# Patient Record
Sex: Female | Born: 1980 | Hispanic: No | Marital: Single | Smoking: Current every day smoker
Health system: Southern US, Community
[De-identification: ages and names within clinical notes are randomized; demographics above are authoritative.]

## PROBLEM LIST (undated history)

## (undated) ENCOUNTER — Ambulatory Visit: Admission: EM | Discharge status: Against Medical Advice | Payer: 59

## (undated) DIAGNOSIS — I10 Essential (primary) hypertension: Secondary | ICD-10-CM

## (undated) HISTORY — PX: NO PAST SURGERIES: SHX2092

---

## 2009-08-05 ENCOUNTER — Emergency Department: Payer: Self-pay | Admitting: Emergency Medicine

## 2010-03-01 ENCOUNTER — Ambulatory Visit: Payer: Self-pay | Admitting: Internal Medicine

## 2016-04-24 ENCOUNTER — Encounter: Payer: Self-pay | Admitting: Emergency Medicine

## 2016-04-24 ENCOUNTER — Ambulatory Visit
Admission: EM | Admit: 2016-04-24 | Discharge: 2016-04-24 | Disposition: A | Discharge status: Home / Self Care | Payer: Managed Care, Other (non HMO) | Attending: Internal Medicine | Admitting: Internal Medicine

## 2016-04-24 DIAGNOSIS — IMO0001 Reserved for inherently not codable concepts without codable children: Secondary | ICD-10-CM

## 2016-04-24 DIAGNOSIS — R030 Elevated blood-pressure reading, without diagnosis of hypertension: Secondary | ICD-10-CM

## 2016-04-24 DIAGNOSIS — R002 Palpitations: Secondary | ICD-10-CM | POA: Diagnosis not present

## 2016-04-24 HISTORY — DX: Essential (primary) hypertension: I10

## 2016-04-24 LAB — COMPREHENSIVE METABOLIC PANEL
ALBUMIN: 4.3 g/dL (ref 3.5–5.0)
ALK PHOS: 55 U/L (ref 38–126)
ALT: 96 U/L — ABNORMAL HIGH (ref 14–54)
ANION GAP: 8 (ref 5–15)
AST: 65 U/L — AB (ref 15–41)
BUN: 16 mg/dL (ref 6–20)
CALCIUM: 9.6 mg/dL (ref 8.9–10.3)
CO2: 26 mmol/L (ref 22–32)
Chloride: 107 mmol/L (ref 101–111)
Creatinine, Ser: 0.94 mg/dL (ref 0.44–1.00)
GFR calc Af Amer: >60 mL/min (ref >60)
GFR calc non Af Amer: >60 mL/min (ref >60)
GLUCOSE: 92 mg/dL (ref 65–99)
Potassium: 3.5 mmol/L (ref 3.5–5.1)
SODIUM: 141 mmol/L (ref 135–145)
Total Bilirubin: 0.3 mg/dL (ref 0.3–1.2)
Total Protein: 6.9 g/dL (ref 6.5–8.1)

## 2016-04-24 LAB — CBC WITH DIFFERENTIAL/PLATELET
BASOS ABS: 0.1 10*3/uL (ref 0–0.1)
EOS ABS: 0.1 10*3/uL (ref 0–0.7)
Eosinophils Relative: 1 %
HCT: 39.6 % (ref 35.0–47.0)
HEMOGLOBIN: 13.7 g/dL (ref 12.0–16.0)
Lymphocytes Relative: 13 %
Lymphs Abs: 1.9 10*3/uL (ref 1.0–3.6)
MCH: 32.9 pg (ref 26.0–34.0)
MCHC: 34.6 g/dL (ref 32.0–36.0)
MCV: 95.1 fL (ref 80.0–100.0)
Monocytes Absolute: 0.7 10*3/uL (ref 0.2–0.9)
Neutro Abs: 11.5 10*3/uL — ABNORMAL HIGH (ref 1.4–6.5)
Platelets: 224 10*3/uL (ref 150–440)
RBC: 4.16 MIL/uL (ref 3.80–5.20)
RDW: 12.3 % (ref 11.5–14.5)
WBC: 14.3 10*3/uL — AB (ref 3.6–11.0)

## 2016-04-24 LAB — URINALYSIS COMPLETE WITH MICROSCOPIC (ARMC ONLY)
BILIRUBIN URINE: NEGATIVE
Glucose, UA: NEGATIVE mg/dL
HGB URINE DIPSTICK: NEGATIVE
Ketones, ur: NEGATIVE mg/dL
Leukocytes, UA: NEGATIVE
Nitrite: NEGATIVE
PH: 5.5 (ref 5.0–8.0)
RBC / HPF: NONE SEEN RBC/hpf (ref 0–5)
Specific Gravity, Urine: 1.03 (ref 1.005–1.030)

## 2016-04-24 LAB — PREGNANCY, URINE: Preg Test, Ur: NEGATIVE

## 2016-04-24 MED ORDER — CLONIDINE HCL 0.1 MG PO TABS
0.1000 mg | ORAL_TABLET | Freq: Once | ORAL | Status: AC
  Administered 2016-04-24: 0.1 mg via ORAL

## 2016-04-24 MED ORDER — CLONIDINE HCL 0.1 MG PO TABS: 0.1000 mg | ORAL_TABLET | Freq: Two times a day (BID) | ORAL | Status: DC | PRN

## 2016-04-24 NOTE — ED Notes (Signed)
Palpitations and discomfort for 2 days on and off

## 2016-04-24 NOTE — ED Provider Notes (Signed)
CSN: [id number]     Arrival date & time 04/24/16  1843 History   First MD Initiated Contact with Patient 04/24/16 1858     Chief Complaint  Patient presents with  . Irregular Heart Beat   HPI  35 year old lady presents this evening with 1-2 day history of intermittent palpitations. Sometimes feels a little lightheaded with these. Has had episodes in the past, and been evaluated in the [facility] system. Blood pressure has typically been elevated during presentations with palpitations. No nausea/vomiting, no unusual weakness or clumsiness, was able to drive herself to the urgent care without incident. No double vision. Had a little bit of shortness of breath at onset of palpitations, but not now. No history of hyperlipidemia, no history of diabetes. Not on blood pressure medication. Some difficulty with intermittent headache, a couple episodes a year. Describes headache as being at the top center of her head, occurs a couple of times per month, and may last from a couple hours to an entire day. Usually responds to Advil. Denies excessive caffeine, use cold medications, sleep aids, weight loss products. Had a "normal size" beer yesterday.  Past Medical History  Diagnosis Date  . Hypertension    Past Surgical History  Procedure Laterality Date  . No past surgeries     Family history: Unknown to patient, she is adopted Social History  Substance Use Topics  . Smoking status: Current Every Day Smoker  . Smokeless tobacco: Never Used  . Alcohol Use: Yes    Review of Systems  All other systems reviewed and are negative.   Allergies  Review of patient's allergies indicates no known allergies.  Home Medications  Patient takes OTC vitamin C supplement, airborne, and biotin Not taking any hormones, not on the pill  Meds Ordered and Administered this Visit   Medications  cloNIDine (CATAPRES) tablet 0.1 mg (0.1 mg Oral Given 04/24/16 1934)     Physical exam: Filed Vitals:   04/24/16 1852  04/24/16 1909 04/24/16 2015  BP: 179/125 171/103 168/100  Pulse:   68  Temp: 98.1 F (36.7 C)    TempSrc: Tympanic    Resp: 16 16 16   Height: 5\' 4"  (1.626 m)    Weight: 160 lb (72.576 kg)    SpO2: 99% 99% 100%    BP 168/100 mmHg  Pulse 68  Temp(Src) 98.1 F (36.7 C) (Tympanic)  Resp 16  Ht 5\' 4"  (1.626 m)  Wt 160 lb (72.576 kg)  BMI 27.45 kg/m2  SpO2 100%  LMP 03/28/2016 Physical Exam  Constitutional: She is oriented to person, place, and time. No distress.  Alert, nicely groomed Lying on stretcher in no acute distress, able to sit up for exam  HENT:  Head: Atraumatic.  Eyes:  Conjugate gaze, no eye redness/drainage  Neck: Neck supple.  Cardiovascular: Normal rate.   Regularly irregular, rare presystole appreciated  Pulmonary/Chest: No respiratory distress. She has no wheezes. She has no rales.  Lungs clear, symmetric breath sounds  Abdominal: Soft. She exhibits no distension. There is no tenderness. There is no rebound and no guarding.  Musculoskeletal: Normal range of motion. She exhibits no edema.  No leg swelling  Neurological: She is alert and oriented to person, place, and time.  Skin: Skin is warm and dry. No rash noted.  No cyanosis  Nursing note and vitals reviewed.   ED Course  Procedures (including critical care time)  ECG: Sinus rhythm at a rate of 87 bpm; QRS 106 ms, QTC 447 ms, QRS  axis -1 No acute ST or T-wave changes. Not able to locate old ECG for direct comparison.  Results for orders placed or performed during the hospital encounter of 04/24/16  CBC with Differential  Result Value Ref Range   WBC 14.3 (H) 3.6 - 11.0 K/uL   RBC 4.16 3.80 - 5.20 MIL/uL   Hemoglobin 13.7 12.0 - 16.0 g/dL   HCT 78.2 95.6 - 21.3 %   MCV 95.1 80.0 - 100.0 fL   MCH 32.9 26.0 - 34.0 pg   MCHC 34.6 32.0 - 36.0 g/dL   RDW 08.6 57.8 - 46.9 %   Platelets 224 150 - 440 K/uL   Neutrophils Relative % 80% %   Neutro Abs 11.5 (H) 1.4 - 6.5 K/uL   Lymphocytes Relative  13% %   Lymphs Abs 1.9 1.0 - 3.6 K/uL   Monocytes Relative 5% %   Monocytes Absolute 0.7 0.2 - 0.9 K/uL   Eosinophils Relative 1% %   Eosinophils Absolute 0.1 0 - 0.7 K/uL   Basophils Relative 1% %   Basophils Absolute 0.1 0 - 0.1 K/uL  Comprehensive metabolic panel  Result Value Ref Range   Sodium 141 135 - 145 mmol/L   Potassium 3.5 3.5 - 5.1 mmol/L   Chloride 107 101 - 111 mmol/L   CO2 26 22 - 32 mmol/L   Glucose, Bld 92 65 - 99 mg/dL   BUN 16 6 - 20 mg/dL   Creatinine, Ser 6.29 0.44 - 1.00 mg/dL   Calcium 9.6 8.9 - 52.8 mg/dL   Total Protein 6.9 6.5 - 8.1 g/dL   Albumin 4.3 3.5 - 5.0 g/dL   AST 65 (H) 15 - 41 U/L   ALT 96 (H) 14 - 54 U/L   Alkaline Phosphatase 55 38 - 126 U/L   Total Bilirubin 0.3 0.3 - 1.2 mg/dL   GFR calc non Af Amer >60 >60 mL/min   GFR calc Af Amer >60 >60 mL/min   Anion gap 8 5 - 15  Urinalysis complete, with microscopic  Result Value Ref Range   Color, Urine YELLOW YELLOW   APPearance CLEAR CLEAR   Glucose, UA NEGATIVE NEGATIVE mg/dL   Bilirubin Urine NEGATIVE NEGATIVE   Ketones, ur NEGATIVE NEGATIVE mg/dL   Specific Gravity, Urine 1.030 1.005 - 1.030   Hgb urine dipstick NEGATIVE NEGATIVE   pH 5.5 5.0 - 8.0   Protein, ur PRESENT (A) NEGATIVE mg/dL   Nitrite NEGATIVE NEGATIVE   Leukocytes, UA NEGATIVE NEGATIVE   RBC / HPF NONE SEEN 0 - 5 RBC/hpf   WBC, UA 0-5 0 - 5 WBC/hpf   Bacteria, UA RARE (A) NONE SEEN   Squamous Epithelial / LPF 0-5 (A) NONE SEEN   Mucous PRESENT    Amorphous Crystal PRESENT   Pregnancy, urine  Result Value Ref Range   Preg Test, Ur NEGATIVE NEGATIVE   Dose of clonidine 0.1 mg given in urgent care with improvement in blood pressure and resolution of palpitations.  MDM   1. Elevated blood pressure   2. Palpitations    Meds ordered this encounter  Medications  . cloNIDine (CATAPRES) tablet 0.1 mg    Sig:   . cloNIDine (CATAPRES) 0.1 MG tablet    Sig: Take 1 tablet (0.1 mg total) by mouth 2 (two) times daily  as needed. For sbp>180 dbp>110    Dispense:  50 tablet    Refill:  0   Encouraged follow-up with PCP to monitor blood pressure and guide therapy,  given Dr. Edwin DadaPlonk's and Dr. Patsey Bertholdook's office numbers to facilitate. Patient has a blood pressure cuff at home and will check her blood pressure a couple times a week either in the afternoons or in the mornings.  If having palpitations or if blood pressure above 180/110, can take 0.1 mg clonidine twice a day as needed.    Eustace MooreLaura W Michayla Mcneil, MD 04/24/16 2044

## 2016-12-14 ENCOUNTER — Other Ambulatory Visit: Payer: Self-pay | Admitting: Internal Medicine

## 2016-12-14 ENCOUNTER — Encounter: Payer: Self-pay | Admitting: Internal Medicine

## 2016-12-14 DIAGNOSIS — B009 Herpesviral infection, unspecified: Secondary | ICD-10-CM | POA: Insufficient documentation

## 2016-12-14 DIAGNOSIS — I10 Essential (primary) hypertension: Secondary | ICD-10-CM | POA: Insufficient documentation

## 2016-12-14 DIAGNOSIS — R002 Palpitations: Secondary | ICD-10-CM | POA: Insufficient documentation

## 2016-12-19 ENCOUNTER — Encounter: Payer: Self-pay | Admitting: Internal Medicine

## 2016-12-19 ENCOUNTER — Ambulatory Visit (INDEPENDENT_AMBULATORY_CARE_PROVIDER_SITE_OTHER): Payer: Managed Care, Other (non HMO) | Admitting: Internal Medicine

## 2016-12-19 VITALS — BP 144/88 | HR 73 | Temp 98.5°F | Ht 64.0 in | Wt 180.0 lb

## 2016-12-19 DIAGNOSIS — F17200 Nicotine dependence, unspecified, uncomplicated: Secondary | ICD-10-CM | POA: Diagnosis not present

## 2016-12-19 DIAGNOSIS — I10 Essential (primary) hypertension: Secondary | ICD-10-CM | POA: Diagnosis not present

## 2016-12-19 DIAGNOSIS — B009 Herpesviral infection, unspecified: Secondary | ICD-10-CM | POA: Diagnosis not present

## 2016-12-19 MED ORDER — VALACYCLOVIR HCL 1 G PO TABS: 1000.0000 mg | ORAL_TABLET | Freq: Every day | ORAL | 5 refills | Status: DC

## 2016-12-19 NOTE — Patient Instructions (Signed)
DASH Eating Plan DASH stands for "Dietary Approaches to Stop Hypertension." The DASH eating plan is a healthy eating plan that has been shown to reduce high blood pressure (hypertension). Additional health benefits may include reducing the risk of type 2 diabetes mellitus, heart disease, and stroke. The DASH eating plan may also help with weight loss. What do I need to know about the DASH eating plan? For the DASH eating plan, you will follow these general guidelines:  Choose foods with less than 150 milligrams of sodium per serving (as listed on the food label).  Use salt-free seasonings or herbs instead of table salt or sea salt.  Check with your health care provider or pharmacist before using salt substitutes.  Eat lower-sodium products. These are often labeled as "low-sodium" or "no salt added."  Eat fresh foods. Avoid eating a lot of canned foods.  Eat more vegetables, fruits, and low-fat dairy products.  Choose whole grains. Look for the word "whole" as the first word in the ingredient list.  Choose fish and skinless chicken or turkey more often than red meat. Limit fish, poultry, and meat to 6 oz (170 g) each day.  Limit sweets, desserts, sugars, and sugary drinks.  Choose heart-healthy fats.  Eat more home-cooked food and less restaurant, buffet, and fast food.  Limit fried foods.  Do not fry foods. Cook foods using methods such as baking, boiling, grilling, and broiling instead.  When eating at a restaurant, ask that your food be prepared with less salt, or no salt if possible. What foods can I eat? Seek help from a dietitian for individual calorie needs. Grains  Whole grain or whole wheat bread. Brown rice. Whole grain or whole wheat pasta. Quinoa, bulgur, and whole grain cereals. Low-sodium cereals. Corn or whole wheat flour tortillas. Whole grain cornbread. Whole grain crackers. Low-sodium crackers. Vegetables  Fresh or frozen vegetables (raw, steamed, roasted, or  grilled). Low-sodium or reduced-sodium tomato and vegetable juices. Low-sodium or reduced-sodium tomato sauce and paste. Low-sodium or reduced-sodium canned vegetables. Fruits  All fresh, canned (in natural juice), or frozen fruits. Meat and Other Protein Products  Ground beef (85% or leaner), grass-fed beef, or beef trimmed of fat. Skinless chicken or turkey. Ground chicken or turkey. Pork trimmed of fat. All fish and seafood. Eggs. Dried beans, peas, or lentils. Unsalted nuts and seeds. Unsalted canned beans. Dairy  Low-fat dairy products, such as skim or 1% milk, 2% or reduced-fat cheeses, low-fat ricotta or cottage cheese, or plain low-fat yogurt. Low-sodium or reduced-sodium cheeses. Fats and Oils  Tub margarines without trans fats. Light or reduced-fat mayonnaise and salad dressings (reduced sodium). Avocado. Safflower, olive, or canola oils. Natural peanut or almond butter. Other  Unsalted popcorn and pretzels. The items listed above may not be a complete list of recommended foods or beverages. Contact your dietitian for more options.  What foods are not recommended? Grains  White bread. White pasta. White rice. Refined cornbread. Bagels and croissants. Crackers that contain trans fat. Vegetables  Creamed or fried vegetables. Vegetables in a cheese sauce. Regular canned vegetables. Regular canned tomato sauce and paste. Regular tomato and vegetable juices. Fruits  Canned fruit in light or heavy syrup. Fruit juice. Meat and Other Protein Products  Fatty cuts of meat. Ribs, chicken wings, bacon, sausage, bologna, salami, chitterlings, fatback, hot dogs, bratwurst, and packaged luncheon meats. Salted nuts and seeds. Canned beans with salt. Dairy  Whole or 2% milk, cream, half-and-half, and cream cheese. Whole-fat or sweetened yogurt. Full-fat cheeses   or blue cheese. Nondairy creamers and whipped toppings. Processed cheese, cheese spreads, or cheese curds. Condiments  Onion and garlic  salt, seasoned salt, table salt, and sea salt. Canned and packaged gravies. Worcestershire sauce. Tartar sauce. Barbecue sauce. Teriyaki sauce. Soy sauce, including reduced sodium. Steak sauce. Fish sauce. Oyster sauce. Cocktail sauce. Horseradish. Ketchup and mustard. Meat flavorings and tenderizers. Bouillon cubes. Hot sauce. Tabasco sauce. Marinades. Taco seasonings. Relishes. Fats and Oils  Butter, stick margarine, lard, shortening, ghee, and bacon fat. Coconut, palm kernel, or palm oils. Regular salad dressings. Other  Pickles and olives. Salted popcorn and pretzels. The items listed above may not be a complete list of foods and beverages to avoid. Contact your dietitian for more information.  Where can I find more information? National Heart, Lung, and Blood Institute: www.nhlbi.nih.gov/health/health-topics/topics/dash/ This information is not intended to replace advice given to you by your health care provider. Make sure you discuss any questions you have with your health care provider. Document Released: 10/18/2011 Document Revised: 04/05/2016 Document Reviewed: 09/02/2013 Elsevier Interactive Patient Education  2017 Elsevier Inc.  

## 2016-12-19 NOTE — Progress Notes (Signed)
Date:  12/19/2016   Name:  Lindsay Mack   DOB:  01-07-1981   MRN:  [id number]   Chief Complaint: Establish Care New patient to establish care and follow up from hypertension.  She was admitted to [facility] with hypertensive urgency and dysarthria.  Stoke work up was negative.  She was started on Amlodipine 5 mg. Discharged with a holter monitor.  She denies side effects to medication other than feeling slightly sluggish.  No headache, constipation or edema.  No recurrence of neurological sx. Herpes - previously on intermittent therapy as needed.  Now having outbreaks every month around her menses.  Review of Systems  Constitutional: Positive for fatigue and unexpected weight change (gained 20 lbs in past 2 years). Negative for chills.  Eyes: Negative for visual disturbance.  Respiratory: Negative for cough, chest tightness, shortness of breath and wheezing.   Cardiovascular: Negative for chest pain, palpitations and leg swelling.  Gastrointestinal: Negative for abdominal pain and constipation.  Genitourinary: Positive for genital sores. Negative for menstrual problem.  Skin: Negative for color change and rash.  Neurological: Negative for tremors, weakness, numbness and headaches.  Hematological: Negative for adenopathy.  Psychiatric/Behavioral: Negative for dysphoric mood and sleep disturbance.    Patient Active Problem List   Diagnosis Date Noted  . Awareness of heartbeats 12/14/2016  . BP (high blood pressure) 12/14/2016  . Herpes simplex type 2 infection 12/14/2016    Prior to Admission medications   Medication Sig Start Date End Date Taking? Authorizing Provider  amLODipine (NORVASC) 5 MG tablet Take by mouth. 12/13/16 12/13/17 Yes Historical Provider, MD  valACYclovir (VALTREX) 500 MG tablet Take by mouth. 05/05/14   Historical Provider, MD    Allergies  Allergen Reactions  . Lac Bovis Other (See Comments)    Other Reaction: GI UPSET    Past Surgical History:  Procedure  Laterality Date  . NO PAST SURGERIES      Social History  Substance Use Topics  . Smoking status: Current Every Day Smoker  . Smokeless tobacco: Never Used  . Alcohol use Yes     Medication list has been reviewed and updated.   Physical Exam  Constitutional: She is oriented to person, place, and time. She appears well-developed. No distress.  HENT:  Head: Normocephalic and atraumatic.  Neck: Normal range of motion. Neck supple. Carotid bruit is not present.  Cardiovascular: Normal rate, regular rhythm and normal heart sounds.   Heart monitor in place  Pulmonary/Chest: Effort normal and breath sounds normal. No respiratory distress. She has no wheezes.  Musculoskeletal: Normal range of motion. She exhibits no edema or tenderness.  Neurological: She is alert and oriented to person, place, and time.  Skin: Skin is warm and dry. No rash noted.  Psychiatric: She has a normal mood and affect. Her speech is normal and behavior is normal. Thought content normal.  Nursing note and vitals reviewed.   BP 122/86   Pulse 73   Temp 98.5 F (36.9 C)   Ht 5\' 4"  (1.626 m)   Wt 180 lb (81.6 kg)   SpO2 99%   BMI 30.90 kg/m   Assessment and Plan: 1. Essential hypertension Continue medication; monitor BP at home Dash diet Regular exercise and weight loss  2. Herpes simplex type 2 infection - valACYclovir (VALTREX) 1000 MG tablet; Take 1 tablet (1,000 mg total) by mouth daily.  Dispense: 30 tablet; Refill: 5  3. Tobacco use disorder Discussed options  4. Health Maintenance Recommend Pap and pelvic  this year  Bari EdwardLaura Jalise Zawistowski, MD [facility]  12/19/2016

## 2017-01-23 ENCOUNTER — Ambulatory Visit (INDEPENDENT_AMBULATORY_CARE_PROVIDER_SITE_OTHER): Payer: Managed Care, Other (non HMO) | Admitting: Internal Medicine

## 2017-01-23 ENCOUNTER — Encounter: Payer: Self-pay | Admitting: Internal Medicine

## 2017-01-23 VITALS — BP 130/78 | HR 74 | Ht 64.0 in | Wt 176.2 lb

## 2017-01-23 DIAGNOSIS — M419 Scoliosis, unspecified: Secondary | ICD-10-CM | POA: Diagnosis not present

## 2017-01-23 DIAGNOSIS — R7989 Other specified abnormal findings of blood chemistry: Secondary | ICD-10-CM | POA: Insufficient documentation

## 2017-01-23 DIAGNOSIS — R945 Abnormal results of liver function studies: Secondary | ICD-10-CM

## 2017-01-23 DIAGNOSIS — I10 Essential (primary) hypertension: Secondary | ICD-10-CM

## 2017-01-23 NOTE — Progress Notes (Signed)
Date:  01/23/2017   Name:  Lindsay Mack   DOB:  [birth year]   MRN:  [id number]   Chief Complaint: Hypertension Hypertension  This is a chronic problem. The current episode started more than 1 year ago. The problem has been waxing and waning since onset. The problem is resistant. Pertinent negatives include no blurred vision, chest pain, headaches, palpitations, peripheral edema or shortness of breath. Past treatments include calcium channel blockers.  Started on medication after hypertensive crisis with stroke like sx.  No recurrence of sx.  BP at home has been good - occasional high readings.  No side effects noted to medication.  Review of labs from hospital - mild increase in LFTs.  No risk of Hepatitis per patient and no hx of similar abnormality.  Back pain - has mild chronic back pain from scoliosis. It does not limit her ADLs or work.  It is not progressive.  Review of Systems  Constitutional: Negative for chills, fatigue and fever.  Eyes: Negative for blurred vision.  Respiratory: Negative for chest tightness and shortness of breath.   Cardiovascular: Negative for chest pain, palpitations and leg swelling.  Gastrointestinal: Negative for constipation.  Musculoskeletal: Positive for back pain (from scoliosis).  Neurological: Negative for tremors, weakness, numbness and headaches.    Patient Active Problem List   Diagnosis Date Noted  . Awareness of heartbeats 12/14/2016  . BP (high blood pressure) 12/14/2016  . Herpes simplex type 2 infection 12/14/2016    Prior to Admission medications   Medication Sig Start Date End Date Taking? Authorizing Provider  amLODipine (NORVASC) 5 MG tablet Take by mouth. 12/13/16 12/13/17 Yes Historical Provider, MD  valACYclovir (VALTREX) 1000 MG tablet Take 1 tablet (1,000 mg total) by mouth daily. 12/19/16  Yes Reubin Milan, MD    Allergies  Allergen Reactions  . Lac Bovis Other (See Comments)    Other Reaction: GI UPSET    Past  Surgical History:  Procedure Laterality Date  . NO PAST SURGERIES      Social History  Substance Use Topics  . Smoking status: Current Every Day Smoker    Packs/day: 0.50    Years: 18.00    Types: Cigarettes  . Smokeless tobacco: Never Used  . Alcohol use No     Medication list has been reviewed and updated.   Physical Exam  Constitutional: She is oriented to person, place, and time. She appears well-developed. No distress.  HENT:  Head: Normocephalic and atraumatic.  Neck: Normal range of motion.  Cardiovascular: Normal rate, regular rhythm and normal heart sounds.   Pulmonary/Chest: Effort normal and breath sounds normal. No respiratory distress. She has no wheezes.  Musculoskeletal: She exhibits no edema.  Neurological: She is alert and oriented to person, place, and time.  Skin: Skin is warm and dry. No rash noted.  Psychiatric: She has a normal mood and affect. Her behavior is normal. Thought content normal.  Nursing note and vitals reviewed.   BP (!) 142/92 (BP Location: Right Arm, Patient Position: Sitting, Cuff Size: Normal)   Pulse 74   Ht 5\' 4"  (1.626 m)   Wt 176 lb 3.2 oz (79.9 kg)   LMP 01/16/2017 (Exact Date)   SpO2 98%   BMI 30.24 kg/m   Assessment and Plan: 1. Essential hypertension Improved - continue current therapy - Comprehensive metabolic panel  2. Elevated liver function tests Repeat testing - Comprehensive metabolic panel  3. Scoliosis of lumbar spine, unspecified scoliosis type Recommend gentle stretching  and exercise for mobility   No orders of the defined types were placed in this encounter.   Bari EdwardLaura Berglund, MD [facility]  01/23/2017

## 2017-01-24 LAB — COMPREHENSIVE METABOLIC PANEL
ALBUMIN: 4.7 g/dL (ref 3.5–5.5)
ALT: 48 IU/L — ABNORMAL HIGH (ref 0–32)
AST: 39 IU/L (ref 0–40)
Albumin/Globulin Ratio: 2 (ref 1.2–2.2)
Alkaline Phosphatase: 72 IU/L (ref 39–117)
BILIRUBIN TOTAL: 0.4 mg/dL (ref 0.0–1.2)
BUN / CREAT RATIO: 23 (ref 9–23)
BUN: 14 mg/dL (ref 6–20)
CO2: 24 mmol/L (ref 18–29)
Calcium: 9.7 mg/dL (ref 8.7–10.2)
Chloride: 98 mmol/L (ref 96–106)
Creatinine, Ser: 0.62 mg/dL (ref 0.57–1.00)
GFR, EST AFRICAN AMERICAN: 135 mL/min/{1.73_m2} (ref >59)
GFR, EST NON AFRICAN AMERICAN: 117 mL/min/{1.73_m2} (ref >59)
Globulin, Total: 2.4 g/dL (ref 1.5–4.5)
Glucose: 79 mg/dL (ref 65–99)
Potassium: 4.6 mmol/L (ref 3.5–5.2)
Sodium: 141 mmol/L (ref 134–144)
TOTAL PROTEIN: 7.1 g/dL (ref 6.0–8.5)

## 2017-05-08 ENCOUNTER — Encounter: Payer: Managed Care, Other (non HMO) | Admitting: Internal Medicine

## 2017-08-04 ENCOUNTER — Other Ambulatory Visit: Payer: Self-pay | Admitting: Internal Medicine

## 2017-08-04 DIAGNOSIS — B009 Herpesviral infection, unspecified: Secondary | ICD-10-CM

## 2017-11-08 ENCOUNTER — Ambulatory Visit (INDEPENDENT_AMBULATORY_CARE_PROVIDER_SITE_OTHER): Payer: Managed Care, Other (non HMO) | Admitting: Internal Medicine

## 2017-11-08 ENCOUNTER — Encounter: Payer: Self-pay | Admitting: Internal Medicine

## 2017-11-08 VITALS — BP 124/80 | HR 76 | Ht 64.0 in | Wt 174.0 lb

## 2017-11-08 DIAGNOSIS — Z113 Encounter for screening for infections with a predominantly sexual mode of transmission: Secondary | ICD-10-CM | POA: Diagnosis not present

## 2017-11-08 DIAGNOSIS — R7989 Other specified abnormal findings of blood chemistry: Secondary | ICD-10-CM

## 2017-11-08 DIAGNOSIS — Z0000 Encounter for general adult medical examination without abnormal findings: Secondary | ICD-10-CM | POA: Diagnosis not present

## 2017-11-08 DIAGNOSIS — I10 Essential (primary) hypertension: Secondary | ICD-10-CM

## 2017-11-08 DIAGNOSIS — R945 Abnormal results of liver function studies: Secondary | ICD-10-CM

## 2017-11-08 NOTE — Progress Notes (Addendum)
Date:  11/08/2017   Name:  Lindsay Mack   DOB:  1981/04/22   MRN:  [id number]   Chief Complaint: Annual Exam (PAP and Breast Exam. ) Lindsay Mack is a 36 y.o. female who presents today for her Complete Annual Exam. She feels well. She reports exercising none but has a physical job. She reports she is sleeping poorly - lifelong issue.  She denies breast problems. Last Pap unknown.  Hypertension  This is a chronic problem. The problem has been resolved since onset. The problem is controlled. Pertinent negatives include no chest pain, headaches, palpitations or shortness of breath. Past treatments include calcium channel blockers.      Review of Systems  Constitutional: Negative for chills, fatigue and fever.  HENT: Negative for congestion, hearing loss, tinnitus, trouble swallowing and voice change.   Eyes: Negative for visual disturbance.  Respiratory: Negative for cough, chest tightness, shortness of breath and wheezing.   Cardiovascular: Negative for chest pain, palpitations and leg swelling.  Gastrointestinal: Negative for abdominal pain, constipation, diarrhea and vomiting.  Endocrine: Negative for polydipsia and polyuria.  Genitourinary: Negative for dysuria, frequency, genital sores, vaginal bleeding and vaginal discharge.  Musculoskeletal: Negative for arthralgias, gait problem and joint swelling.  Skin: Negative for color change and rash.  Neurological: Negative for dizziness, tremors, light-headedness and headaches.  Hematological: Negative for adenopathy. Does not bruise/bleed easily.  Psychiatric/Behavioral: Negative for dysphoric mood and sleep disturbance. The patient is not nervous/anxious.     Patient Active Problem List   Diagnosis Date Noted  . Elevated liver function tests 01/23/2017  . Scoliosis of lumbar spine 01/23/2017  . Awareness of heartbeats 12/14/2016  . Essential hypertension 12/14/2016  . Herpes simplex type 2 infection 12/14/2016    Prior to  Admission medications   Medication Sig Start Date End Date Taking? Authorizing Provider  amLODipine (NORVASC) 5 MG tablet Take by mouth. 12/13/16 12/13/17 Yes [provider]  valACYclovir (VALTREX) 1000 MG tablet TAKE 1 TABLET (1,000 MG TOTAL) BY MOUTH DAILY. 08/04/17  Yes Reubin MilanBerglund, Sharone Picchi H, MD    Allergies  Allergen Reactions  . Lac Bovis Other (See Comments)    Other Reaction: GI UPSET    Past Surgical History:  Procedure Laterality Date  . NO PAST SURGERIES      Social History   Tobacco Use  . Smoking status: Current Every Day Smoker    Packs/day: 0.50    Years: 18.00    Pack years: 9.00    Types: Cigarettes  . Smokeless tobacco: Never Used  Substance Use Topics  . Alcohol use: No    Alcohol/week: 1.2 oz    Types: 2 Standard drinks or equivalent per week  . Drug use: No     Medication list has been reviewed and updated.  PHQ 2/9 Scores 11/08/2017  PHQ - 2 Score 0    Physical Exam  Constitutional: She is oriented to person, place, and time. She appears well-developed and well-nourished. No distress.  HENT:  Head: Normocephalic and atraumatic.  Right Ear: Tympanic membrane and ear canal normal.  Left Ear: Tympanic membrane and ear canal normal.  Nose: Right sinus exhibits no maxillary sinus tenderness. Left sinus exhibits no maxillary sinus tenderness.  Mouth/Throat: Uvula is midline and oropharynx is clear and moist.  Eyes: Conjunctivae and EOM are normal. Right eye exhibits no discharge. Left eye exhibits no discharge. No scleral icterus.  Neck: Normal range of motion. Carotid bruit is not present. No erythema present. No thyromegaly present.  Cardiovascular: Normal rate, regular rhythm, normal heart sounds and normal pulses.  Pulmonary/Chest: Effort normal. No respiratory distress. She has no wheezes. Right breast exhibits no mass, no nipple discharge, no skin change and no tenderness. Left breast exhibits no mass, no nipple discharge, no skin change and no  tenderness.  Abdominal: Soft. Bowel sounds are normal. There is no hepatosplenomegaly. There is no tenderness. There is no CVA tenderness.  Genitourinary: Vagina normal and uterus normal. There is no tenderness, lesion or injury on the right labia. There is no tenderness, lesion or injury on the left labia. Cervix exhibits discharge. Cervix exhibits no motion tenderness and no friability. Right adnexum displays no mass, no tenderness and no fullness. Left adnexum displays no mass, no tenderness and no fullness.  Musculoskeletal: Normal range of motion.  Lymphadenopathy:    She has no cervical adenopathy.    She has no axillary adenopathy.  Neurological: She is alert and oriented to person, place, and time. She has normal reflexes. No cranial nerve deficit or sensory deficit.  Skin: Skin is warm, dry and intact. No rash noted.  Psychiatric: She has a normal mood and affect. Her speech is normal and behavior is normal. Thought content normal.  Nursing note and vitals reviewed.   BP 124/80   Pulse 76   Ht 5\' 4"  (1.626 m)   Wt 174 lb (78.9 kg)   LMP 10/23/2017 (Approximate)   SpO2 99%   BMI 29.87 kg/m   Assessment and Plan: 1. Annual physical exam - Lipid panel - Pap IG, CT/NG NAA, and HPV (high risk)  2. Essential hypertension Controlled, continue medication - CBC with Differential/Platelet - TSH  3. Elevated liver function tests Recheck labs - Comprehensive metabolic panel  4. Screen for STD (sexually transmitted disease) - GC/Chlamydia Probe Amp - HIV antibody - Pap IG, CT/NG NAA, and HPV (high risk)   No orders of the defined types were placed in this encounter.   Partially dictated using Animal nutritionistDragon software. Any errors are unintentional.  Bari EdwardLaura Arvis Miguez, MD [facility]  11/08/2017

## 2017-11-08 NOTE — Patient Instructions (Signed)

## 2017-11-09 LAB — CBC WITH DIFFERENTIAL/PLATELET
BASOS ABS: 0 10*3/uL (ref 0.0–0.2)
BASOS: 0 %
EOS (ABSOLUTE): 0.1 10*3/uL (ref 0.0–0.4)
Eos: 2 %
Hematocrit: 43.6 % (ref 34.0–46.6)
Hemoglobin: 15.2 g/dL (ref 11.1–15.9)
Immature Grans (Abs): 0 10*3/uL (ref 0.0–0.1)
Immature Granulocytes: 0 %
Lymphocytes Absolute: 1.1 10*3/uL (ref 0.7–3.1)
Lymphs: 17 %
MCH: 34.8 pg — AB (ref 26.6–33.0)
MCHC: 34.9 g/dL (ref 31.5–35.7)
MCV: 100 fL — AB (ref 79–97)
Monocytes Absolute: 0.5 10*3/uL (ref 0.1–0.9)
Monocytes: 8 %
NEUTROS ABS: 4.9 10*3/uL (ref 1.4–7.0)
Neutrophils: 73 %
PLATELETS: 253 10*3/uL (ref 150–379)
RBC: 4.37 x10E6/uL (ref 3.77–5.28)
RDW: 12.6 % (ref 12.3–15.4)
WBC: 6.6 10*3/uL (ref 3.4–10.8)

## 2017-11-09 LAB — COMPREHENSIVE METABOLIC PANEL
A/G RATIO: 2.6 — AB (ref 1.2–2.2)
ALBUMIN: 5.4 g/dL (ref 3.5–5.5)
ALK PHOS: 71 IU/L (ref 39–117)
ALT: 100 IU/L — AB (ref 0–32)
AST: 113 IU/L — ABNORMAL HIGH (ref 0–40)
BILIRUBIN TOTAL: 1.2 mg/dL (ref 0.0–1.2)
BUN / CREAT RATIO: 14 (ref 9–23)
BUN: 10 mg/dL (ref 6–20)
CHLORIDE: 96 mmol/L (ref 96–106)
CO2: 21 mmol/L (ref 20–29)
Calcium: 9.8 mg/dL (ref 8.7–10.2)
Creatinine, Ser: 0.69 mg/dL (ref 0.57–1.00)
GFR calc non Af Amer: 112 mL/min/{1.73_m2} (ref >59)
GFR, EST AFRICAN AMERICAN: 130 mL/min/{1.73_m2} (ref >59)
GLUCOSE: 64 mg/dL — AB (ref 65–99)
Globulin, Total: 2.1 g/dL (ref 1.5–4.5)
POTASSIUM: 4.2 mmol/L (ref 3.5–5.2)
Sodium: 138 mmol/L (ref 134–144)
TOTAL PROTEIN: 7.5 g/dL (ref 6.0–8.5)

## 2017-11-09 LAB — LIPID PANEL
CHOLESTEROL TOTAL: 242 mg/dL — AB (ref 100–199)
Chol/HDL Ratio: 2.4 ratio (ref 0.0–4.4)
HDL: 101 mg/dL (ref >39)
LDL Calculated: 122 mg/dL — ABNORMAL HIGH (ref 0–99)
Triglycerides: 97 mg/dL (ref 0–149)
VLDL CHOLESTEROL CAL: 19 mg/dL (ref 5–40)

## 2017-11-09 LAB — HIV ANTIBODY (ROUTINE TESTING W REFLEX): HIV SCREEN 4TH GENERATION: NONREACTIVE

## 2017-11-09 LAB — TSH: TSH: 0.552 u[IU]/mL (ref 0.450–4.500)

## 2017-11-14 LAB — FERRITIN: FERRITIN: 444 ng/mL — AB (ref 15–150)

## 2017-11-14 LAB — PAP IG, CT-NG NAA, HPV HIGH-RISK
Chlamydia, Nuc. Acid Amp: NEGATIVE
GONOCOCCUS BY NUCLEIC ACID AMP: NEGATIVE
HPV, HIGH-RISK: NEGATIVE
PAP Smear Comment: 0

## 2017-11-14 LAB — HEPATITIS B SURFACE ANTIGEN: Hepatitis B Surface Ag: NEGATIVE

## 2017-11-14 LAB — SPECIMEN STATUS REPORT

## 2017-11-14 LAB — HEPATITIS C ANTIBODY

## 2018-03-11 ENCOUNTER — Other Ambulatory Visit: Payer: Self-pay | Admitting: Internal Medicine

## 2018-08-22 ENCOUNTER — Other Ambulatory Visit: Payer: Self-pay | Admitting: Internal Medicine

## 2018-08-22 DIAGNOSIS — B009 Herpesviral infection, unspecified: Secondary | ICD-10-CM

## 2018-11-11 ENCOUNTER — Ambulatory Visit (INDEPENDENT_AMBULATORY_CARE_PROVIDER_SITE_OTHER): Payer: 59 | Admitting: Internal Medicine

## 2018-11-11 ENCOUNTER — Encounter: Payer: Self-pay | Admitting: Internal Medicine

## 2018-11-11 VITALS — BP 144/90 | HR 89 | Ht 64.0 in | Wt 179.0 lb

## 2018-11-11 DIAGNOSIS — Z23 Encounter for immunization: Secondary | ICD-10-CM | POA: Diagnosis not present

## 2018-11-11 DIAGNOSIS — Z0000 Encounter for general adult medical examination without abnormal findings: Secondary | ICD-10-CM

## 2018-11-11 DIAGNOSIS — F17200 Nicotine dependence, unspecified, uncomplicated: Secondary | ICD-10-CM

## 2018-11-11 DIAGNOSIS — I10 Essential (primary) hypertension: Secondary | ICD-10-CM

## 2018-11-11 DIAGNOSIS — E785 Hyperlipidemia, unspecified: Secondary | ICD-10-CM

## 2018-11-11 LAB — POCT URINALYSIS DIPSTICK
BILIRUBIN UA: NEGATIVE
Glucose, UA: NEGATIVE
KETONES UA: NEGATIVE
Leukocytes, UA: NEGATIVE
Nitrite, UA: NEGATIVE
PH UA: 6 (ref 5.0–8.0)
Protein, UA: POSITIVE — AB
Spec Grav, UA: 1.01 (ref 1.010–1.025)
UROBILINOGEN UA: 0.2 U/dL

## 2018-11-11 NOTE — Progress Notes (Signed)
Date:  11/11/2018   Name:  Lindsay Mack   DOB:  08-06-1981   MRN:  [id number]   Chief Complaint: Annual Exam (Breast Exam. ) Lindsay Mack is a 37 y.o. female who presents today for her Complete Annual Exam. She feels well. She reports exercising at work. She reports she is sleeping well. She denies breast issues.  She is not currently sexually active and has no concerns about STIs.  She continue to smoke 1/2 ppd.  She has a low interest in quitting at this time.  Hypertension  This is a chronic problem. Condition status: she does not check her BP. Associated symptoms include headaches. Pertinent negatives include no chest pain, palpitations or shortness of breath. There are no associated agents to hypertension. There are no known risk factors for coronary artery disease. Past treatments include calcium channel blockers. The current treatment provides significant improvement. There are no compliance problems (may be eating too many salty foods).     Review of Systems  Constitutional: Negative for chills, fatigue and fever.  HENT: Negative for congestion, hearing loss, tinnitus, trouble swallowing and voice change.   Eyes: Negative for visual disturbance.  Respiratory: Negative for cough, chest tightness, shortness of breath and wheezing.   Cardiovascular: Negative for chest pain, palpitations and leg swelling.  Gastrointestinal: Negative for abdominal pain, constipation, diarrhea and vomiting.  Endocrine: Negative for polydipsia and polyuria.  Genitourinary: Negative for dysuria, frequency, genital sores, menstrual problem, vaginal bleeding and vaginal discharge.  Musculoskeletal: Negative for arthralgias, gait problem and joint swelling.  Skin: Negative for color change and rash.  Neurological: Positive for headaches. Negative for dizziness, tremors and light-headedness.  Hematological: Negative for adenopathy. Does not bruise/bleed easily.  Psychiatric/Behavioral: Negative for dysphoric  mood and sleep disturbance. The patient is not nervous/anxious.     Patient Active Problem List   Diagnosis Date Noted  . Mild hyperlipidemia 11/11/2018  . Tobacco use disorder 11/11/2018  . Elevated liver function tests 01/23/2017  . Scoliosis of lumbar spine 01/23/2017  . Awareness of heartbeats 12/14/2016  . Essential hypertension 12/14/2016  . Herpes simplex type 2 infection 12/14/2016    Allergies  Allergen Reactions  . Lac Bovis Other (See Comments)    Other Reaction: GI UPSET    Past Surgical History:  Procedure Laterality Date  . NO PAST SURGERIES      Social History   Tobacco Use  . Smoking status: Current Every Day Smoker    Packs/day: 0.50    Years: 18.00    Pack years: 9.00    Types: Cigarettes  . Smokeless tobacco: Never Used  Substance Use Topics  . Alcohol use: No    Alcohol/week: 2.0 standard drinks    Types: 2 Standard drinks or equivalent per week  . Drug use: No     Medication list has been reviewed and updated.  Current Meds  Medication Sig  . amLODipine (NORVASC) 5 MG tablet TAKE 1 TABLET (5 MG TOTAL) BY MOUTH ONCE DAILY.  . valACYclovir (VALTREX) 1000 MG tablet TAKE 1 TABLET (1,000 MG TOTAL) BY MOUTH DAILY.    PHQ 2/9 Scores 11/11/2018 11/08/2017  PHQ - 2 Score 0 0    Physical Exam Vitals signs and nursing note reviewed.  Constitutional:      General: She is not in acute distress.    Appearance: She is well-developed.  HENT:     Head: Normocephalic and atraumatic.     Right Ear: Tympanic membrane and ear canal normal.  Left Ear: Tympanic membrane and ear canal normal.     Nose:     Right Sinus: No maxillary sinus tenderness.     Left Sinus: No maxillary sinus tenderness.     Mouth/Throat:     Pharynx: Uvula midline.  Eyes:     General: No scleral icterus.       Right eye: No discharge.        Left eye: No discharge.     Conjunctiva/sclera: Conjunctivae normal.  Neck:     Musculoskeletal: Normal range of motion. No  erythema.     Thyroid: No thyromegaly.     Vascular: No carotid bruit.  Cardiovascular:     Rate and Rhythm: Normal rate and regular rhythm.     Pulses: Normal pulses.     Heart sounds: Normal heart sounds.  Pulmonary:     Effort: Pulmonary effort is normal. No respiratory distress.     Breath sounds: No wheezing.  Chest:     Breasts:        Right: No mass, nipple discharge, skin change or tenderness.        Left: No mass, nipple discharge, skin change or tenderness.  Abdominal:     General: Bowel sounds are normal.     Palpations: Abdomen is soft.     Tenderness: There is no abdominal tenderness.  Musculoskeletal: Normal range of motion.  Lymphadenopathy:     Cervical: No cervical adenopathy.  Skin:    General: Skin is warm and dry.     Findings: No rash.     Comments: Multiple healing superficial lacerations on forearms noted - pt stated from boxes at work  Neurological:     Mental Status: She is alert and oriented to person, place, and time.     Cranial Nerves: No cranial nerve deficit.     Sensory: No sensory deficit.     Deep Tendon Reflexes: Reflexes are normal and symmetric.  Psychiatric:        Speech: Speech normal.        Behavior: Behavior normal.        Thought Content: Thought content normal.    Wt Readings from Last 3 Encounters:  11/11/18 179 lb (81.2 kg)  11/08/17 174 lb (78.9 kg)  01/23/17 176 lb 3.2 oz (79.9 kg)    BP (!) 144/90 (BP Location: Left Arm, Patient Position: Sitting, Cuff Size: Normal)   Pulse 89   Ht 5\' 4"  (1.626 m)   Wt 179 lb (81.2 kg)   SpO2 97%   BMI 30.73 kg/m   Assessment and Plan: 1. Annual physical exam Pap due in 2 years along with screening mammogram - POCT urinalysis dipstick  2. Essential hypertension Reduce sodium intake Continue amlodipine and monitor BP at home - return if not <140/80 - CBC with Differential/Platelet - Comprehensive metabolic panel - TSH  3. Mild hyperlipidemia Check labs - Lipid panel  4.  Tobacco use disorder Pt encouraged to cut back or quit completely  5. Need for diphtheria-tetanus-pertussis (Tdap) vaccine - Tdap vaccine greater than or equal to 7yo IM   Partially dictated using Animal nutritionistDragon software. Any errors are unintentional.  Bari EdwardLaura Alesa Echevarria, MD [facility]  11/11/2018

## 2018-11-11 NOTE — Patient Instructions (Addendum)
Check BP once a week or so and record.  Goal BP is 140/80 or less.  Return if not a goal.  DASH Eating Plan DASH stands for "Dietary Approaches to Stop Hypertension." The DASH eating plan is a healthy eating plan that has been shown to reduce high blood pressure (hypertension). It may also reduce your risk for type 2 diabetes, heart disease, and stroke. The DASH eating plan may also help with weight loss. What are tips for following this plan?  General guidelines  Avoid eating more than 2,300 mg (milligrams) of salt (sodium) a day. If you have hypertension, you may need to reduce your sodium intake to 1,500 mg a day.  Limit alcohol intake to no more than 1 drink a day for nonpregnant women and 2 drinks a day for men. One drink equals 12 oz of beer, 5 oz of wine, or 1 oz of hard liquor.  Work with your health care provider to maintain a healthy body weight or to lose weight. Ask what an ideal weight is for you.  Get at least 30 minutes of exercise that causes your heart to beat faster (aerobic exercise) most days of the week. Activities may include walking, swimming, or biking.  Work with your health care provider or diet and nutrition specialist (dietitian) to adjust your eating plan to your individual calorie needs. Reading food labels   Check food labels for the amount of sodium per serving. Choose foods with less than 5 percent of the Daily Value of sodium. Generally, foods with less than 300 mg of sodium per serving fit into this eating plan.  To find whole grains, look for the word "whole" as the first word in the ingredient list. Shopping  Buy products labeled as "low-sodium" or "no salt added."  Buy fresh foods. Avoid canned foods and premade or frozen meals. Cooking  Avoid adding salt when cooking. Use salt-free seasonings or herbs instead of table salt or sea salt. Check with your health care provider or pharmacist before using salt substitutes.  Do not fry foods. Cook foods  using healthy methods such as baking, boiling, grilling, and broiling instead.  Cook with heart-healthy oils, such as olive, canola, soybean, or sunflower oil. Meal planning  Eat a balanced diet that includes: ? 5 or more servings of fruits and vegetables each day. At each meal, try to fill half of your plate with fruits and vegetables. ? Up to 6-8 servings of whole grains each day. ? Less than 6 oz of lean meat, poultry, or fish each day. A 3-oz serving of meat is about the same size as a deck of cards. One egg equals 1 oz. ? 2 servings of low-fat dairy each day. ? A serving of nuts, seeds, or beans 5 times each week. ? Heart-healthy fats. Healthy fats called Omega-3 fatty acids are found in foods such as flaxseeds and coldwater fish, like sardines, salmon, and mackerel.  Limit how much you eat of the following: ? Canned or prepackaged foods. ? Food that is high in trans fat, such as fried foods. ? Food that is high in saturated fat, such as fatty meat. ? Sweets, desserts, sugary drinks, and other foods with added sugar. ? Full-fat dairy products.  Do not salt foods before eating.  Try to eat at least 2 vegetarian meals each week.  Eat more home-cooked food and less restaurant, buffet, and fast food.  When eating at a restaurant, ask that your food be prepared with less salt  or no salt, if possible. What foods are recommended? The items listed may not be a complete list. Talk with your dietitian about what dietary choices are best for you. Grains Whole-grain or whole-wheat bread. Whole-grain or whole-wheat pasta. Brown rice. [place]row. Bulgur. Whole-grain and low-sodium cereals. Pita bread. Low-fat, low-sodium crackers. Whole-wheat flour tortillas. Vegetables Fresh or frozen vegetables (raw, steamed, roasted, or grilled). Low-sodium or reduced-sodium tomato and vegetable juice. Low-sodium or reduced-sodium tomato sauce and tomato paste. Low-sodium or reduced-sodium canned  vegetables. Fruits All fresh, dried, or frozen fruit. Canned fruit in natural juice (without added sugar). Meat and other protein foods Skinless chicken or [place]. Ground chicken or [place]. Pork with fat trimmed off. Fish and seafood. Egg whites. Dried beans, peas, or lentils. Unsalted nuts, nut butters, and seeds. Unsalted canned beans. Lean cuts of beef with fat trimmed off. Low-sodium, lean deli meat. Dairy Low-fat (1%) or fat-free (skim) milk. Fat-free, low-fat, or reduced-fat cheeses. Nonfat, low-sodium ricotta or cottage cheese. Low-fat or nonfat yogurt. Low-fat, low-sodium cheese. Fats and oils Soft margarine without trans fats. Vegetable oil. Low-fat, reduced-fat, or light mayonnaise and salad dressings (reduced-sodium). Canola, safflower, olive, soybean, and sunflower oils. Avocado. Seasoning and other foods Herbs. Spices. Seasoning mixes without salt. Unsalted popcorn and pretzels. Fat-free sweets. What foods are not recommended? The items listed may not be a complete list. Talk with your dietitian about what dietary choices are best for you. Grains Baked goods made with fat, such as croissants, muffins, or some breads. Dry pasta or rice meal packs. Vegetables Creamed or fried vegetables. Vegetables in a cheese sauce. Regular canned vegetables (not low-sodium or reduced-sodium). Regular canned tomato sauce and paste (not low-sodium or reduced-sodium). Regular tomato and vegetable juice (not low-sodium or reduced-sodium). Angie Fava. Olives. Fruits Canned fruit in a light or heavy syrup. Fried fruit. Fruit in cream or butter sauce. Meat and other protein foods Fatty cuts of meat. Ribs. Fried meat. Berniece Salines. Sausage. Bologna and other processed lunch meats. Salami. Fatback. Hotdogs. Bratwurst. Salted nuts and seeds. Canned beans with added salt. Canned or smoked fish. Whole eggs or egg yolks. Chicken or [place] with skin. Dairy Whole or 2% milk, cream, and half-and-half. Whole or full-fat  cream cheese. Whole-fat or sweetened yogurt. Full-fat cheese. Nondairy creamers. Whipped toppings. Processed cheese and cheese spreads. Fats and oils Butter. Stick margarine. Lard. Shortening. Ghee. Bacon fat. Tropical oils, such as coconut, palm kernel, or palm oil. Seasoning and other foods Salted popcorn and pretzels. Onion salt, garlic salt, seasoned salt, table salt, and sea salt. Worcestershire sauce. Tartar sauce. Barbecue sauce. Teriyaki sauce. Soy sauce, including reduced-sodium. Steak sauce. Canned and packaged gravies. Fish sauce. Oyster sauce. Cocktail sauce. Horseradish that you find on the shelf. Ketchup. Mustard. Meat flavorings and tenderizers. Bouillon cubes. Hot sauce and Tabasco sauce. Premade or packaged marinades. Premade or packaged taco seasonings. Relishes. Regular salad dressings. Where to find more information:  National Heart, Lung, and [place]: https://wilson-eaton.com/  American Heart Association: www.heart.org Summary  The DASH eating plan is a healthy eating plan that has been shown to reduce high blood pressure (hypertension). It may also reduce your risk for type 2 diabetes, heart disease, and stroke.  With the DASH eating plan, you should limit salt (sodium) intake to 2,300 mg a day. If you have hypertension, you may need to reduce your sodium intake to 1,500 mg a day.  When on the DASH eating plan, aim to eat more fresh fruits and vegetables, whole grains, lean proteins, low-fat dairy,  and heart-healthy fats.  Work with your health care provider or diet and nutrition specialist (dietitian) to adjust your eating plan to your individual calorie needs. This information is not intended to replace advice given to you by your health care provider. Make sure you discuss any questions you have with your health care provider. Document Released: 10/18/2011 Document Revised: 10/22/2016 Document Reviewed: 10/22/2016 Elsevier Interactive Patient Education  2019 [employer]. Tdap Vaccine (Tetanus, Diphtheria and Pertussis): What You Need to Know 1. Why get vaccinated? Tetanus, diphtheria and pertussis are very serious diseases. Tdap vaccine can protect [place] from these diseases. And, Tdap vaccine given to pregnant women can protect newborn babies against pertussis.Marland Kitchen TETANUS (Lockjaw) is rare in the [place] today. It causes painful muscle tightening and stiffness, usually all over the body.  It can lead to tightening of muscles in the head and neck so you can't open your mouth, swallow, or sometimes even breathe. Tetanus kills about 1 out of 10 people who are infected even after receiving the best medical care. DIPHTHERIA is also rare in the [place] today. It can cause a thick coating to form in the back of the throat.  It can lead to breathing problems, heart failure, paralysis, and death. PERTUSSIS (Whooping Cough) causes severe coughing spells, which can cause difficulty breathing, vomiting and disturbed sleep.  It can also lead to weight loss, incontinence, and rib fractures. Up to 2 in 100 adolescents and 5 in 100 adults with pertussis are hospitalized or have complications, which could include pneumonia or death. These diseases are caused by bacteria. Diphtheria and pertussis are spread from person to person through secretions from coughing or sneezing. Tetanus enters the body through cuts, scratches, or wounds. Before vaccines, as many as 200,000 cases of diphtheria, 200,000 cases of pertussis, and hundreds of cases of tetanus, were reported in the [place] each year. Since vaccination began, reports of cases for tetanus and diphtheria have dropped by about 99% and for pertussis by about 80%. 2. Tdap vaccine Tdap vaccine can protect adolescents and adults from tetanus, diphtheria, and pertussis. One dose of Tdap is routinely given at age 48 or 53. People who did not get Tdap at that age should get it as soon as possible. Tdap is especially  important for healthcare professionals and anyone having close contact with a baby younger than 12 months. Pregnant women should get a dose of Tdap during every pregnancy, to protect the newborn from pertussis. Infants are most at risk for severe, life-threatening complications from pertussis. Another vaccine, called Td, protects against tetanus and diphtheria, but not pertussis. A Td booster should be given every 10 years. Tdap may be given as one of these boosters if you have never gotten Tdap before. Tdap may also be given after a severe cut or burn to prevent tetanus infection. Your doctor or the person giving you the vaccine can give you more information. Tdap may safely be given at the same time as other vaccines. 3. Some people should not get this vaccine  A person who has ever had a life-threatening allergic reaction after a previous dose of any diphtheria, tetanus or pertussis containing vaccine, OR has a severe allergy to any part of this vaccine, should not get Tdap vaccine. Tell the person giving the vaccine about any severe allergies.  Anyone who had coma or long repeated seizures within 7 days after a childhood dose of DTP or DTaP, or a previous dose of Tdap, should not get  Tdap, unless a cause other than the vaccine was found. They can still get Td.  Talk to your doctor if you: ? have seizures or another nervous system problem, ? had severe pain or swelling after any vaccine containing diphtheria, tetanus or pertussis, ? ever had a condition called Guillain-Barr Syndrome (GBS), ? aren't feeling well on the day the shot is scheduled. 4. Risks With any medicine, including vaccines, there is a chance of side effects. These are usually mild and go away on their own. Serious reactions are also possible but are rare. Most people who get Tdap vaccine do not have any problems with it. Mild problems following Tdap (Did not interfere with activities)  Pain where the shot was given (about  3 in 4 adolescents or 2 in 3 adults)  Redness or swelling where the shot was given (about 1 person in 5)  Mild fever of at least 100.39F (up to about 1 in 25 adolescents or 1 in 100 adults)  Headache (about 3 or 4 people in 10)  Tiredness (about 1 person in 3 or 4)  Nausea, vomiting, diarrhea, stomach ache (up to 1 in 4 adolescents or 1 in 10 adults)  Chills, sore joints (about 1 person in 10)  Body aches (about 1 person in 3 or 4)  Rash, swollen glands (uncommon) Moderate problems following Tdap (Interfered with activities, but did not require medical attention)  Pain where the shot was given (up to 1 in 5 or 6)  Redness or swelling where the shot was given (up to about 1 in 16 adolescents or 1 in 12 adults)  Fever over 102F (about 1 in 100 adolescents or 1 in 250 adults)  Headache (about 1 in 7 adolescents or 1 in 10 adults)  Nausea, vomiting, diarrhea, stomach ache (up to 1 or 3 people in 100)  Swelling of the entire arm where the shot was given (up to about 1 in 500). Severe problems following Tdap (Unable to perform usual activities; required medical attention)  Swelling, severe pain, bleeding and redness in the arm where the shot was given (rare). Problems that could happen after any vaccine:  People sometimes faint after a medical procedure, including vaccination. Sitting or lying down for about 15 minutes can help prevent fainting, and injuries caused by a fall. Tell your doctor if you feel dizzy, or have vision changes or ringing in the ears.  Some people get severe pain in the shoulder and have difficulty moving the arm where a shot was given. This happens very rarely.  Any medication can cause a severe allergic reaction. Such reactions from a vaccine are very rare, estimated at fewer than 1 in a million doses, and would happen within a few minutes to a few hours after the vaccination. As with any medicine, there is a very remote chance of a vaccine causing a  serious injury or death. The safety of vaccines is always being monitored. For more information, visit: http://floyd.org/www.cdc.gov/vaccinesafety/ 5. What if there is a serious problem? What should I look for?  Look for anything that concerns you, such as signs of a severe allergic reaction, very high fever, or unusual behavior. Signs of a severe allergic reaction can include hives, swelling of the face and throat, difficulty breathing, a fast heartbeat, dizziness, and weakness. These would usually start a few minutes to a few hours after the vaccination. What should I do?  If you think it is a severe allergic reaction or other emergency that can't wait,  call 9-1-1 or get the person to the nearest hospital. Otherwise, call your doctor.  Afterward, the reaction should be reported to the Vaccine Adverse Event Reporting System (VAERS). Your doctor might file this report, or you can do it yourself through the VAERS web site at www.vaers.LAgents.no, or by calling 1-707-783-8526. VAERS does not give medical advice. 6. The National Vaccine Injury Compensation Program The [employer] Vaccine Injury Compensation Program (VICP) is a federal program that was created to compensate people who may have been injured by certain vaccines. Persons who believe they may have been injured by a vaccine can learn about the program and about filing a claim by calling 1-949 625 7458 or visiting the VICP website at SpiritualWord.at. There is a time limit to file a claim for compensation. 7. How can I learn more?  Ask your doctor. He or she can give you the vaccine package insert or suggest other sources of information.  Call your local or state health department.  Contact the Centers for Disease Control and Prevention (CDC): ? Call 856 648 4282 (1-800-CDC-INFO) or ? Visit CDC's website at PicCapture.uy Vaccine Information Statement Tdap Vaccine (01/05/2014) This information is not intended to replace advice given  to you by your health care provider. Make sure you discuss any questions you have with your health care provider. Document Released: 04/29/2012 Document Revised: 06/16/2018 Document Reviewed: 06/16/2018 Elsevier Interactive Patient Education  2019 [employer].

## 2018-11-12 LAB — CBC WITH DIFFERENTIAL/PLATELET
BASOS: 1 %
Basophils Absolute: 0.1 10*3/uL (ref 0.0–0.2)
EOS (ABSOLUTE): 0.1 10*3/uL (ref 0.0–0.4)
EOS: 2 %
HEMATOCRIT: 41.2 % (ref 34.0–46.6)
Hemoglobin: 14.7 g/dL (ref 11.1–15.9)
IMMATURE GRANS (ABS): 0 10*3/uL (ref 0.0–0.1)
Immature Granulocytes: 0 %
Lymphocytes Absolute: 1.4 10*3/uL (ref 0.7–3.1)
Lymphs: 20 %
MCH: 36.5 pg — AB (ref 26.6–33.0)
MCHC: 35.7 g/dL (ref 31.5–35.7)
MCV: 102 fL — AB (ref 79–97)
MONOS ABS: 0.4 10*3/uL (ref 0.1–0.9)
Monocytes: 6 %
NEUTROS ABS: 4.9 10*3/uL (ref 1.4–7.0)
NEUTROS PCT: 71 %
Platelets: 294 10*3/uL (ref 150–450)
RBC: 4.03 x10E6/uL (ref 3.77–5.28)
RDW: 12.1 % — ABNORMAL LOW (ref 12.3–15.4)
WBC: 6.9 10*3/uL (ref 3.4–10.8)

## 2018-11-12 LAB — LIPID PANEL
CHOL/HDL RATIO: 3.5 ratio (ref 0.0–4.4)
Cholesterol, Total: 276 mg/dL — ABNORMAL HIGH (ref 100–199)
HDL: 79 mg/dL (ref >39)
LDL Calculated: 158 mg/dL — ABNORMAL HIGH (ref 0–99)
Triglycerides: 193 mg/dL — ABNORMAL HIGH (ref 0–149)
VLDL Cholesterol Cal: 39 mg/dL (ref 5–40)

## 2018-11-12 LAB — COMPREHENSIVE METABOLIC PANEL
A/G RATIO: 2.3 — AB (ref 1.2–2.2)
ALT: 109 IU/L — ABNORMAL HIGH (ref 0–32)
AST: 76 IU/L — ABNORMAL HIGH (ref 0–40)
Albumin: 5 g/dL (ref 3.5–5.5)
Alkaline Phosphatase: 86 IU/L (ref 39–117)
BUN / CREAT RATIO: 20 (ref 9–23)
BUN: 13 mg/dL (ref 6–20)
Bilirubin Total: 0.4 mg/dL (ref 0.0–1.2)
CO2: 20 mmol/L (ref 20–29)
Calcium: 10.1 mg/dL (ref 8.7–10.2)
Chloride: 102 mmol/L (ref 96–106)
Creatinine, Ser: 0.66 mg/dL (ref 0.57–1.00)
GFR, EST AFRICAN AMERICAN: 131 mL/min/{1.73_m2} (ref >59)
GFR, EST NON AFRICAN AMERICAN: 113 mL/min/{1.73_m2} (ref >59)
GLOBULIN, TOTAL: 2.2 g/dL (ref 1.5–4.5)
Glucose: 98 mg/dL (ref 65–99)
POTASSIUM: 3.8 mmol/L (ref 3.5–5.2)
SODIUM: 139 mmol/L (ref 134–144)
TOTAL PROTEIN: 7.2 g/dL (ref 6.0–8.5)

## 2018-11-12 LAB — TSH: TSH: 1.09 u[IU]/mL (ref 0.450–4.500)

## 2019-03-27 ENCOUNTER — Other Ambulatory Visit: Payer: Self-pay | Admitting: Internal Medicine

## 2019-04-11 ENCOUNTER — Other Ambulatory Visit: Payer: Self-pay | Admitting: Internal Medicine

## 2019-04-11 DIAGNOSIS — B009 Herpesviral infection, unspecified: Secondary | ICD-10-CM

## 2019-05-12 ENCOUNTER — Ambulatory Visit: Payer: 59 | Admitting: Internal Medicine

## 2019-10-29 ENCOUNTER — Other Ambulatory Visit: Payer: Self-pay | Admitting: Internal Medicine

## 2019-10-29 DIAGNOSIS — B009 Herpesviral infection, unspecified: Secondary | ICD-10-CM

## 2019-11-18 ENCOUNTER — Other Ambulatory Visit: Payer: Self-pay

## 2019-11-18 ENCOUNTER — Encounter: Payer: Self-pay | Admitting: Internal Medicine

## 2019-11-18 ENCOUNTER — Ambulatory Visit (INDEPENDENT_AMBULATORY_CARE_PROVIDER_SITE_OTHER): Payer: Managed Care, Other (non HMO) | Admitting: Internal Medicine

## 2019-11-18 VITALS — BP 116/82 | HR 65 | Ht 64.0 in | Wt 183.0 lb

## 2019-11-18 DIAGNOSIS — I10 Essential (primary) hypertension: Secondary | ICD-10-CM

## 2019-11-18 DIAGNOSIS — R7989 Other specified abnormal findings of blood chemistry: Secondary | ICD-10-CM

## 2019-11-18 DIAGNOSIS — Z0000 Encounter for general adult medical examination without abnormal findings: Secondary | ICD-10-CM

## 2019-11-18 DIAGNOSIS — G4700 Insomnia, unspecified: Secondary | ICD-10-CM | POA: Diagnosis not present

## 2019-11-18 DIAGNOSIS — E785 Hyperlipidemia, unspecified: Secondary | ICD-10-CM | POA: Diagnosis not present

## 2019-11-18 LAB — POCT URINALYSIS DIPSTICK
Bilirubin, UA: NEGATIVE
Glucose, UA: NEGATIVE
Ketones, UA: NEGATIVE
Leukocytes, UA: NEGATIVE
Nitrite, UA: NEGATIVE
Protein, UA: NEGATIVE
Spec Grav, UA: 1.015 (ref 1.010–1.025)
Urobilinogen, UA: 0.2 E.U./dL
pH, UA: 6 (ref 5.0–8.0)

## 2019-11-18 NOTE — Progress Notes (Signed)
Date:  11/18/2019   Name:  Lindsay Mack   DOB:  1981/01/18   MRN:  [id number]   Chief Complaint: Annual Exam (Breast Exam. No pap- 2 more years.) Lindsay Mack is a 39 y.o. female who presents today for her Complete Annual Exam. She feels well. 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 She reports she is sleeping poorly. She denies breast issues.  She declines flu vaccine.  Pap - 2018 negative with cotesting  Immunization History  Administered Date(s) Administered  . Meningococcal Conjugate 10/29/2014  . Tdap 11/11/2018    Hypertension This is a chronic problem. The problem is unchanged (at home BP 120/90). The problem is controlled. Pertinent negatives include no chest pain, headaches, palpitations or shortness of breath. Past treatments include calcium channel blockers. The current treatment provides significant improvement. There are no compliance problems.   Insomnia Primary symptoms: sleep disturbance.  The problem occurs nightly. Treatments tried: over the counter aids but no prescription meds. The treatment provided no relief.    Lab Results  Component Value Date   CREATININE 0.66 11/11/2018   BUN 13 11/11/2018   NA 139 11/11/2018   K 3.8 11/11/2018   CL 102 11/11/2018   CO2 20 11/11/2018   Lab Results  Component Value Date   CHOL 276 (H) 11/11/2018   HDL 79 11/11/2018   LDLCALC 158 (H) 11/11/2018   TRIG 193 (H) 11/11/2018   CHOLHDL 3.5 11/11/2018   Lab Results  Component Value Date   TSH 1.090 11/11/2018   No results found for: HGBA1C   Review of Systems  Constitutional: Negative for chills, fatigue and fever.  HENT: Negative for congestion, hearing loss, tinnitus, trouble swallowing and voice change.   Eyes: Negative for visual disturbance.  Respiratory: Negative for cough, chest tightness, shortness of breath and wheezing.   Cardiovascular: Negative for chest pain, palpitations and leg swelling.  Gastrointestinal: Negative for  abdominal pain, constipation, diarrhea and vomiting.  Endocrine: Negative for polydipsia and polyuria.  Genitourinary: Negative for dysuria, frequency, genital sores, vaginal bleeding and vaginal discharge.  Musculoskeletal: Negative for arthralgias, gait problem and joint swelling.  Skin: Negative for color change and rash.  Neurological: Negative for dizziness, tremors, light-headedness and headaches.  Hematological: Negative for adenopathy. Does not bruise/bleed easily.  Psychiatric/Behavioral: Positive for sleep disturbance. Negative for dysphoric mood. The patient has insomnia. The patient is not nervous/anxious.     Patient Active Problem List   Diagnosis Date Noted  . Mild hyperlipidemia 11/11/2018  . Tobacco use disorder 11/11/2018  . Elevated liver function tests 01/23/2017  . Scoliosis of lumbar spine 01/23/2017  . Awareness of heartbeats 12/14/2016  . Essential hypertension 12/14/2016  . Herpes simplex type 2 infection 12/14/2016    Allergies  Allergen Reactions  . Lac Bovis Other (See Comments)    Other Reaction: GI UPSET    Past Surgical History:  Procedure Laterality Date  . NO PAST SURGERIES      Social History   Tobacco Use  . Smoking status: Current Every Day Smoker    Packs/day: 0.50    Years: 18.00    Pack years: 9.00    Types: Cigarettes  . Smokeless tobacco: Never Used  Substance Use Topics  . Alcohol use: No    Alcohol/week: 2.0 standard drinks    Types: 2 Standard drinks or equivalent per week  . Drug use: No     Medication list has been reviewed and updated.  Current Meds  Medication  Sig  . amLODipine (NORVASC) 5 MG tablet TAKE 1 TABLET (5 MG TOTAL) BY MOUTH ONCE DAILY.  . valACYclovir (VALTREX) 1000 MG tablet TAKE 1 TABLET BY MOUTH EVERY DAY    PHQ 2/9 Scores 11/18/2019 11/11/2018 11/08/2017  PHQ - 2 Score 0 0 0  PHQ- 9 Score 0 - -    BP Readings from Last 3 Encounters:  11/18/19 116/82  11/11/18 (!) 144/90  11/08/17 124/80     Physical Exam Vitals and nursing note reviewed.  Constitutional:      General: She is not in acute distress.    Appearance: She is well-developed.  HENT:     Head: Normocephalic and atraumatic.     Right Ear: Tympanic membrane and ear canal normal.     Left Ear: Tympanic membrane and ear canal normal.     Nose:     Right Sinus: No maxillary sinus tenderness.     Left Sinus: No maxillary sinus tenderness.  Eyes:     General: No scleral icterus.       Right eye: No discharge.        Left eye: No discharge.     Conjunctiva/sclera: Conjunctivae normal.  Neck:     Thyroid: No thyromegaly.     Vascular: No carotid bruit.  Cardiovascular:     Rate and Rhythm: Normal rate and regular rhythm.     Pulses: Normal pulses.     Heart sounds: Normal heart sounds.  Pulmonary:     Effort: Pulmonary effort is normal. No respiratory distress.     Breath sounds: No wheezing.  Chest:     Breasts:        Right: No mass, nipple discharge, skin change or tenderness.        Left: No mass, nipple discharge, skin change or tenderness.  Abdominal:     General: Bowel sounds are normal.     Palpations: Abdomen is soft.     Tenderness: There is no abdominal tenderness.  Musculoskeletal:        General: Normal range of motion.     Cervical back: Normal range of motion. No erythema.  Lymphadenopathy:     Cervical: No cervical adenopathy.  Skin:    General: Skin is warm and dry.     Findings: No rash.  Neurological:     Mental Status: She is alert and oriented to person, place, and time.     Cranial Nerves: No cranial nerve deficit.     Sensory: No sensory deficit.     Deep Tendon Reflexes: Reflexes are normal and symmetric.  Psychiatric:        Speech: Speech normal.        Behavior: Behavior normal.        Thought Content: Thought content normal.     Wt Readings from Last 3 Encounters:  11/18/19 183 lb (83 kg)  11/11/18 179 lb (81.2 kg)  11/08/17 174 lb (78.9 kg)    BP 116/82    Pulse 65   Ht 5\' 4"  (1.626 m)   Wt 183 lb (83 kg)   LMP 11/16/2019 (Exact Date)   SpO2 97%   BMI 31.41 kg/m   Assessment and Plan: 1. Annual physical exam Normal exam except for weight Continue healthy diet, recommend regular exercise - POCT urinalysis dipstick  2. Essential hypertension Clinically stable exam with well controlled BP.   Tolerating medications, amlodipine, without side effects at this time. Pt to continue current regimen and low sodium diet; benefits  of regular exercise as able discussed. - CBC with Differential/Platelet - TSH  3. Mild hyperlipidemia No medications have been indicated in the past Will recheck and advise - Lipid panel  4. Elevated liver function tests Will continue to monitor Work up negative - Comprehensive metabolic panel   5. Insomnia, unspecified type Sample of Belsomra 10 mg given   Partially dictated using Animal nutritionist. Any errors are unintentional.  Bari Edward, MD [facility]  11/18/2019

## 2019-11-18 NOTE — Patient Instructions (Signed)
Try Nexium or Prilosec daily for 2-4 weeks for acid symptoms

## 2019-11-19 LAB — CBC WITH DIFFERENTIAL/PLATELET
Basophils Absolute: 0.1 10*3/uL (ref 0.0–0.2)
Basos: 1 %
EOS (ABSOLUTE): 0.3 10*3/uL (ref 0.0–0.4)
Eos: 3 %
Hematocrit: 41.2 % (ref 34.0–46.6)
Hemoglobin: 14.9 g/dL (ref 11.1–15.9)
Immature Grans (Abs): 0.1 10*3/uL (ref 0.0–0.1)
Immature Granulocytes: 1 %
Lymphocytes Absolute: 1.9 10*3/uL (ref 0.7–3.1)
Lymphs: 19 %
MCH: 37.6 pg — ABNORMAL HIGH (ref 26.6–33.0)
MCHC: 36.2 g/dL — ABNORMAL HIGH (ref 31.5–35.7)
MCV: 104 fL — ABNORMAL HIGH (ref 79–97)
Monocytes Absolute: 0.6 10*3/uL (ref 0.1–0.9)
Monocytes: 6 %
Neutrophils Absolute: 7.1 10*3/uL — ABNORMAL HIGH (ref 1.4–7.0)
Neutrophils: 70 %
Platelets: 339 10*3/uL (ref 150–450)
RBC: 3.96 x10E6/uL (ref 3.77–5.28)
RDW: 12.1 % (ref 11.7–15.4)
WBC: 10 10*3/uL (ref 3.4–10.8)

## 2019-11-19 LAB — COMPREHENSIVE METABOLIC PANEL
ALT: 193 IU/L — ABNORMAL HIGH (ref 0–32)
AST: 193 IU/L — ABNORMAL HIGH (ref 0–40)
Albumin/Globulin Ratio: 1.9 (ref 1.2–2.2)
Albumin: 4.6 g/dL (ref 3.8–4.8)
Alkaline Phosphatase: 138 IU/L — ABNORMAL HIGH (ref 39–117)
BUN/Creatinine Ratio: 13 (ref 9–23)
BUN: 9 mg/dL (ref 6–20)
Bilirubin Total: 0.5 mg/dL (ref 0.0–1.2)
CO2: 24 mmol/L (ref 20–29)
Calcium: 10.1 mg/dL (ref 8.7–10.2)
Chloride: 99 mmol/L (ref 96–106)
Creatinine, Ser: 0.7 mg/dL (ref 0.57–1.00)
GFR calc Af Amer: 127 mL/min/{1.73_m2} (ref >59)
GFR calc non Af Amer: 110 mL/min/{1.73_m2} (ref >59)
Globulin, Total: 2.4 g/dL (ref 1.5–4.5)
Glucose: 85 mg/dL (ref 65–99)
Potassium: 4.2 mmol/L (ref 3.5–5.2)
Sodium: 139 mmol/L (ref 134–144)
Total Protein: 7 g/dL (ref 6.0–8.5)

## 2019-11-19 LAB — TSH: TSH: 1.22 u[IU]/mL (ref 0.450–4.500)

## 2019-11-19 LAB — LIPID PANEL
Chol/HDL Ratio: 4.6 ratio — ABNORMAL HIGH (ref 0.0–4.4)
Cholesterol, Total: 257 mg/dL — ABNORMAL HIGH (ref 100–199)
HDL: 56 mg/dL (ref >39)
LDL Chol Calc (NIH): 162 mg/dL — ABNORMAL HIGH (ref 0–99)
Triglycerides: 213 mg/dL — ABNORMAL HIGH (ref 0–149)
VLDL Cholesterol Cal: 39 mg/dL (ref 5–40)

## 2019-11-25 LAB — FERRITIN: Ferritin: 1583 ng/mL — ABNORMAL HIGH (ref 15–150)

## 2019-11-25 LAB — SPECIMEN STATUS REPORT

## 2019-11-27 ENCOUNTER — Other Ambulatory Visit: Payer: Self-pay

## 2019-11-27 DIAGNOSIS — R7989 Other specified abnormal findings of blood chemistry: Secondary | ICD-10-CM

## 2019-11-27 DIAGNOSIS — R945 Abnormal results of liver function studies: Secondary | ICD-10-CM

## 2019-11-27 NOTE — Progress Notes (Signed)
Patient informed and agrees to hematology referral.

## 2019-12-10 NOTE — Progress Notes (Signed)
[facility]  [address] [place], [place] [address] Phone: [phone]  Fax: [phone]   Clinic Day:  12/11/2019  Referring physician: Reubin Milan, MD  Chief Complaint: Lindsay Mack is a 39 y.o. female with an abnormal LFTs and elevated ferritin who is referred in consultation by Dr Bari Edward for assessment and management.   HPI:  The patient states that she recently learned about abnormal LFTs and an elevated ferritin with her last clinic visit.  Family history for hemochromatosis is unknown as she was adopted.  She states that her diet is horrible.  She eats mostly frozen or fast foods.  She eats meats almost every day and dark leafy green vegetables a few times a week.  She denies any known exposure to hepatitis.  She has never had a blood transfusion.  She drinks 1-2 beers/day.  She denies any infection or inflammation in her body.  She has had back pain since being a child; symptoms felt secondary to scoliosis.  Her left hip feels hot and aches and pain travels down to knees. She relates this pain to her work.  She doesn't take any herbal products.  She has been taking omeprazole since her last visit with Dr. Judithann Graves on 11/18/2019. She had nausea and was vomiting yellow bile. Omeprazole has helped tremendously.   CBC followed 04/24/2016: Hematocrit 39.6, hemoglobin 13.7, MCV   95.1, platelets 224,000, WBC 14300.  12/12/2016: Hematocrit 38.5, hemoglobin 13.7, MCV   98.0, platelets 240,000, WBC 10400.  11/08/2017: Hematocrit 43.6, hemoglobin 15.2, MCV 100.0, platelets 253,000, WBC 6600. 11/11/2018: Hematocrit 41.2, hemoglobin 14.7, MCV 102.0, platelets 294,000, WBC 6900.  11/18/2019: Hematocrit 41.2, hemoglobin 14.9, MCV 104.0, platelets 339,000, WBC 10000.   LFT have been monitored: 04/24/2016:  AST 65, ALT 96, bilirubin 0.3, alkaline phosphatase 55. 12/12/2016:  AST 53, ALT 79, bilirubin 1.0, alkaline phosphatase 59. 01/23/2017:  AST  39, ALT 48, bilirubin 0.4, alkaline phosphatase 72. 11/08/2017:  AST 113,  ALT 100, bilirubin 1.2, alkaline phosphatase 71. 11/11/2018:  AST 76, ALT 109, bilirubin 0.4, alkaline phosphatase 86.  11/18/2019:  AST 193, ALT 193, bilirubin 0.5, alkaline phosphatase 138.   Ferritin was 444 on 11/08/2017 and 1583 on 11/18/2019.  Additional labs included:  Hepatitis B surface antigen and hepatitis C antibody negative on 11/08/2017.  Cholesterol 257, triglycerides 213, and LDL 162 on 11/18/2019.  Symptomatically, she feels "fine".  She denies any arthritis.  She notes chronic back and left hip discomfort.   Past Medical History:  Diagnosis Date  . Hypertension     Past Surgical History:  Procedure Laterality Date  . NO PAST SURGERIES      Family History  Adopted: Yes  Family history unknown: Yes    Social History:  reports that she has been smoking cigarettes. She has a 9.00 pack-year smoking history. She has never used smokeless tobacco. She reports that she does not drink alcohol or use drugs.She smokes 1/2 ppd intermittently since she was 39 yrs old.  She drinks 1-2 beers every day. She occasionally drink hard liquor. She works at [employer]. The patient is alone today.  Allergies:  Allergies  Allergen Reactions  . Lac Bovis Other (See Comments)    Other Reaction: GI UPSET    Current Medications: Current Outpatient Medications  Medication Sig Dispense Refill  . amLODipine (NORVASC) 5 MG tablet TAKE 1 TABLET (5 MG TOTAL) BY MOUTH ONCE DAILY. 90 tablet 4  . OMEPRAZOLE PO Take 5 mg  by mouth daily.    . valACYclovir (VALTREX) 1000 MG tablet TAKE 1 TABLET BY MOUTH EVERY DAY 30 tablet 5   No current facility-administered medications for this visit.    Review of Systems  Constitutional: Negative.  Negative for chills, diaphoresis, fever, malaise/fatigue and weight loss.       Feels "fine".  HENT: Negative.  Negative for congestion, ear pain, hearing loss, nosebleeds,  sinus pain and sore throat.   Eyes: Negative.  Negative for blurred vision and double vision.  Respiratory: Negative.  Negative for cough, sputum production, shortness of breath and wheezing.   Cardiovascular: Negative.  Negative for chest pain, palpitations and leg swelling.  Gastrointestinal: Positive for nausea (improved after taking omeprazole). Negative for abdominal pain, blood in stool, constipation, diarrhea, heartburn, melena and vomiting.  Genitourinary: Negative.  Negative for dysuria, frequency and urgency.  Musculoskeletal: Positive for back pain and myalgias (left hip pain). Negative for falls and neck pain.  Skin: Negative.  Negative for itching and rash.  Neurological: Negative.  Negative for dizziness, tingling, sensory change, focal weakness, weakness and headaches.  Endo/Heme/Allergies: Negative.  Does not bruise/bleed easily.  Psychiatric/Behavioral: Negative.  Negative for depression and memory loss. The patient is not nervous/anxious and does not have insomnia.    Performance status (ECOG): 0 - Asymptomatic  Vitals Blood pressure (!) 153/97, pulse 79, temperature 98.3 F (36.8 C), temperature source Tympanic, resp. rate 18, height 5\' 4"  (1.626 m), weight 186 lb 4.6 oz (84.5 kg), last menstrual period 11/16/2019, SpO2 100 %.   Physical Exam  Constitutional: She is oriented to person, place, and time. She appears well-developed and well-nourished. No distress.  HENT:  Head: Normocephalic and atraumatic.  Mouth/Throat: Oropharynx is clear and moist. Lacerations present. No oropharyngeal exudate.  Short dark hair.  Right cheek trauma s/p chewing.  Mask.  Eyes: Pupils are equal, round, and reactive to light. Conjunctivae and EOM are normal. No scleral icterus.  Glasses.  Brown eyes.  Neck: No JVD present.  Cardiovascular: Normal rate, regular rhythm and normal heart sounds. Exam reveals no gallop.  No murmur heard. Pulmonary/Chest: Effort normal and breath sounds normal.  No respiratory distress. She has no wheezes. She has no rales.  Abdominal: Soft. Bowel sounds are normal. She exhibits no distension and no mass. There is no abdominal tenderness. There is no rebound and no guarding.  Musculoskeletal:        General: No edema. Normal range of motion.     Cervical back: Normal range of motion and neck supple.  Lymphadenopathy:       Head (right side): No preauricular, no posterior auricular and no occipital adenopathy present.       Head (left side): No preauricular, no posterior auricular and no occipital adenopathy present.    She has no cervical adenopathy.    She has no axillary adenopathy.       Right: No inguinal and no supraclavicular adenopathy present.       Left: No inguinal and no supraclavicular adenopathy present.  Neurological: She is alert and oriented to person, place, and time.  Skin: Skin is warm and dry. No rash noted. She is not diaphoretic. No erythema. No pallor.  Tattoos.  Psychiatric: She has a normal mood and affect. Her behavior is normal. Judgment and thought content normal.  Nursing note and vitals reviewed.   No visits with results within 3 Day(s) from this visit.  Latest known visit with results is:  Office Visit on 11/18/2019  Component Date Value Ref Range Status  . WBC 11/18/2019 10.0  3.4 - 10.8 x10E3/uL Final  . RBC 11/18/2019 3.96  3.77 - 5.28 x10E6/uL Final  . Hemoglobin 11/18/2019 14.9  11.1 - 15.9 g/dL Final  . Hematocrit 00/17/4944 41.2  34.0 - 46.6 % Final  . MCV 11/18/2019 104* 79 - 97 fL Final  . MCH 11/18/2019 37.6* 26.6 - 33.0 pg Final  . MCHC 11/18/2019 36.2* 31.5 - 35.7 g/dL Final  . RDW 96/75/9163 12.1  11.7 - 15.4 % Final  . Platelets 11/18/2019 339  150 - 450 x10E3/uL Final  . Neutrophils 11/18/2019 70  Not Estab. % Final  . Lymphs 11/18/2019 19  Not Estab. % Final  . Monocytes 11/18/2019 6  Not Estab. % Final  . Eos 11/18/2019 3  Not Estab. % Final  . Basos 11/18/2019 1  Not Estab. % Final  .  Neutrophils Absolute 11/18/2019 7.1* 1.4 - 7.0 x10E3/uL Final  . Lymphocytes Absolute 11/18/2019 1.9  0.7 - 3.1 x10E3/uL Final  . Monocytes Absolute 11/18/2019 0.6  0.1 - 0.9 x10E3/uL Final  . EOS (ABSOLUTE) 11/18/2019 0.3  0.0 - 0.4 x10E3/uL Final  . Basophils Absolute 11/18/2019 0.1  0.0 - 0.2 x10E3/uL Final  . Immature Granulocytes 11/18/2019 1  Not Estab. % Final  . Immature Grans (Abs) 11/18/2019 0.1  0.0 - 0.1 x10E3/uL Final  . Glucose 11/18/2019 85  65 - 99 mg/dL Final  . BUN 84/66/5993 9  6 - 20 mg/dL Final  . Creatinine, Ser 11/18/2019 0.70  0.57 - 1.00 mg/dL Final  . GFR calc non Af Amer 11/18/2019 110  >59 mL/min/1.73 Final  . GFR calc Af Amer 11/18/2019 127  >59 mL/min/1.73 Final  . BUN/Creatinine Ratio 11/18/2019 13  9 - 23 Final  . Sodium 11/18/2019 139  134 - 144 mmol/L Final  . Potassium 11/18/2019 4.2  3.5 - 5.2 mmol/L Final  . Chloride 11/18/2019 99  96 - 106 mmol/L Final  . CO2 11/18/2019 24  20 - 29 mmol/L Final  . Calcium 11/18/2019 10.1  8.7 - 10.2 mg/dL Final  . Total Protein 11/18/2019 7.0  6.0 - 8.5 g/dL Final  . Albumin 57/11/7791 4.6  3.8 - 4.8 g/dL Final  . Globulin, Total 11/18/2019 2.4  1.5 - 4.5 g/dL Final  . Albumin/Globulin Ratio 11/18/2019 1.9  1.2 - 2.2 Final  . Bilirubin Total 11/18/2019 0.5  0.0 - 1.2 mg/dL Final  . Alkaline Phosphatase 11/18/2019 138* 39 - 117 IU/L Final  . AST 11/18/2019 193* 0 - 40 IU/L Final  . ALT 11/18/2019 193* 0 - 32 IU/L Final  . Cholesterol, Total 11/18/2019 257* 100 - 199 mg/dL Final  . Triglycerides 11/18/2019 213* 0 - 149 mg/dL Final  . HDL 90/30/0923 56  >39 mg/dL Final  . VLDL Cholesterol Cal 11/18/2019 39  5 - 40 mg/dL Final  . LDL Chol Calc (NIH) 11/18/2019 162* 0 - 99 mg/dL Final  . Chol/HDL Ratio 11/18/2019 4.6* 0.0 - 4.4 ratio Final   Comment:                                   T. Chol/HDL Ratio  Men  Women                               1/2 Avg.Risk  3.4    3.3                                    Avg.Risk  5.0    4.4                                2X Avg.Risk  9.6    7.1                                3X Avg.Risk 23.4   11.0   . TSH 11/18/2019 1.220  0.450 - 4.500 uIU/mL Final  . Color, UA 11/18/2019 [place]   Final  . Clarity, UA 11/18/2019 clear   Final  . Glucose, UA 11/18/2019 Negative  Negative Final  . Bilirubin, UA 11/18/2019 neg   Final  . Ketones, UA 11/18/2019 neg   Final  . Spec Grav, UA 11/18/2019 1.015  1.010 - 1.025 Final  . Blood, UA 11/18/2019 LARGE 3+   Final   Menstrual Cycle On  . pH, UA 11/18/2019 6.0  5.0 - 8.0 Final  . Protein, UA 11/18/2019 Negative  Negative Final  . Urobilinogen, UA 11/18/2019 0.2  0.2 or 1.0 E.U./dL Final  . Nitrite, UA 11/18/2019 neg   Final  . Leukocytes, UA 11/18/2019 Negative  Negative Final  . Appearance 11/18/2019 clear   Final  . Odor 11/18/2019 none   Final  . Ferritin 11/18/2019 1,583* 15 - 150 ng/mL Final  . specimen status report 11/18/2019 Comment   Final   Comment: Written Authorization Written Authorization Written Authorization Received. Authorization received from Halina Maidens 11-25-2019 Logged by Marchelle Gearing     Assessment:  Lindsay Mack is a 39 y.o. female with elevated liver function tests and elevated ferritin.  AST and ALT have been elevated since 11/08/2017.  She denies any new medications or herbal products.  She drinks 1-2 beers/day.  Ferritin has been followed:  444 on 11/08/2017 and 1583 on 11/18/2019.  Additional labs:  Hepatitis B surface antigen and hepatitis C antibody were negative on 11/08/2017.  Cholesterol 257, triglycerides 213, and LDL 162 on 11/18/2019.  She has had progressive macrocytosis since 11/08/2017.  MCV was 104 on 11/18/2019.  Symptomatically, she feels "fine".  Exam reveals no hepatomegaly.  Plan: 1.   Labs today: CBC with diff, ferritin, iron studies, hemochromatosis assay, hepatitis B core antibody total, hepatitis B surface antigen, hepatitis C  antibody, sed rate, CRP, retic, B1, B12, folate, TSH. 2.   Elevated ferritin and LFTs  Etiology of elevated ferritin may be secondary to an acute phase reactant or an increase in iron stores.   Family history is unknown.  Etiology of elevated ferritin likely associated with increased liver function tests.   She notes drinking 1 -2 beers/day.   Diet appears good.  Work-up discussed  If work-up unrevealing, anticipate referral to GI. 3.   Macrocytic RBC indices  RBC macrocytosis increasing over past 3 years.  No medications implicated.  Doubt MDS.    She drinks alcohol daily, although the amount seems small.  Etiology may be secondary to a  vitamin deficiency or liver disease.  Work-up discussed. 4.   RTC in 10-14 days for MD assessment (Doximity) and review of work-up.  I discussed the assessment and treatment plan with the patient.  The patient was provided an opportunity to ask questions and all were answered.  The patient agreed with the plan and demonstrated an understanding of the instructions.  The patient was advised to call back if the symptoms worsen or if the condition fails to improve as anticipated.  I provided 31 minutes (1:55 PM - 2:25 PM) of face-to-face time during this encounter and > 50% was spent counseling as documented under my assessment and plan.  An additional 15 minutes were spent reviewing records including notes and labs as documented in HPI.   Manning Luna C. Merlene Pullingorcoran, MD, PhD    12/11/2019, 2:25 PM  I, Arnette NorrisMari Johnson, am acting as scribe for General MotorsMelissa C. Merlene Pullingorcoran, MD, PhD.  I, Ac Colan C. Merlene Pullingorcoran, MD, have reviewed the above documentation for accuracy and completeness, and I agree with the above.

## 2019-12-11 ENCOUNTER — Other Ambulatory Visit: Payer: Self-pay

## 2019-12-11 ENCOUNTER — Inpatient Hospital Stay: Payer: Managed Care, Other (non HMO) | Attending: Hematology and Oncology | Admitting: Hematology and Oncology

## 2019-12-11 ENCOUNTER — Encounter: Payer: Self-pay | Admitting: Hematology and Oncology

## 2019-12-11 ENCOUNTER — Inpatient Hospital Stay: Payer: Managed Care, Other (non HMO)

## 2019-12-11 VITALS — BP 153/97 | HR 79 | Temp 98.3°F | Resp 18 | Ht 64.0 in | Wt 186.3 lb

## 2019-12-11 DIAGNOSIS — R112 Nausea with vomiting, unspecified: Secondary | ICD-10-CM | POA: Insufficient documentation

## 2019-12-11 DIAGNOSIS — M549 Dorsalgia, unspecified: Secondary | ICD-10-CM | POA: Insufficient documentation

## 2019-12-11 DIAGNOSIS — Z79899 Other long term (current) drug therapy: Secondary | ICD-10-CM | POA: Insufficient documentation

## 2019-12-11 DIAGNOSIS — D7589 Other specified diseases of blood and blood-forming organs: Secondary | ICD-10-CM | POA: Insufficient documentation

## 2019-12-11 DIAGNOSIS — R7989 Other specified abnormal findings of blood chemistry: Secondary | ICD-10-CM | POA: Insufficient documentation

## 2019-12-11 DIAGNOSIS — F17210 Nicotine dependence, cigarettes, uncomplicated: Secondary | ICD-10-CM | POA: Diagnosis not present

## 2019-12-11 DIAGNOSIS — K219 Gastro-esophageal reflux disease without esophagitis: Secondary | ICD-10-CM

## 2019-12-11 DIAGNOSIS — I10 Essential (primary) hypertension: Secondary | ICD-10-CM | POA: Insufficient documentation

## 2019-12-11 DIAGNOSIS — M25552 Pain in left hip: Secondary | ICD-10-CM | POA: Diagnosis not present

## 2019-12-11 DIAGNOSIS — R945 Abnormal results of liver function studies: Secondary | ICD-10-CM

## 2019-12-11 LAB — FERRITIN: Ferritin: 1253 ng/mL — ABNORMAL HIGH (ref 11–307)

## 2019-12-11 LAB — IRON AND TIBC
Iron: 72 ug/dL (ref 28–170)
Saturation Ratios: 20 % (ref 10.4–31.8)
TIBC: 353 ug/dL (ref 250–450)
UIBC: 281 ug/dL

## 2019-12-11 LAB — FOLATE: Folate: 11.2 ng/mL (ref >5.9)

## 2019-12-11 LAB — CBC WITH DIFFERENTIAL/PLATELET
Abs Immature Granulocytes: 0.11 10*3/uL — ABNORMAL HIGH (ref 0.00–0.07)
Basophils Absolute: 0.1 10*3/uL (ref 0.0–0.1)
Basophils Relative: 1 %
Eosinophils Absolute: 0.2 10*3/uL (ref 0.0–0.5)
Eosinophils Relative: 2 %
HCT: 42.7 % (ref 36.0–46.0)
Hemoglobin: 15 g/dL (ref 12.0–15.0)
Immature Granulocytes: 1 %
Lymphocytes Relative: 18 %
Lymphs Abs: 2.3 10*3/uL (ref 0.7–4.0)
MCH: 36.7 pg — ABNORMAL HIGH (ref 26.0–34.0)
MCHC: 35.1 g/dL (ref 30.0–36.0)
MCV: 104.4 fL — ABNORMAL HIGH (ref 80.0–100.0)
Monocytes Absolute: 0.6 10*3/uL (ref 0.1–1.0)
Monocytes Relative: 5 %
Neutro Abs: 9.7 10*3/uL — ABNORMAL HIGH (ref 1.7–7.7)
Neutrophils Relative %: 73 %
Platelets: 382 10*3/uL (ref 150–400)
RBC: 4.09 MIL/uL (ref 3.87–5.11)
RDW: 11.1 % — ABNORMAL LOW (ref 11.5–15.5)
WBC: 12.9 10*3/uL — ABNORMAL HIGH (ref 4.0–10.5)
nRBC: 0 % (ref 0.0–0.2)

## 2019-12-11 LAB — SEDIMENTATION RATE: Sed Rate: 28 mm/hr — ABNORMAL HIGH (ref 0–20)

## 2019-12-11 LAB — RETICULOCYTES
Immature Retic Fract: 12.4 % (ref 2.3–15.9)
RBC.: 4.08 MIL/uL (ref 3.87–5.11)
Retic Count, Absolute: 102.8 10*3/uL (ref 19.0–186.0)
Retic Ct Pct: 2.5 % (ref 0.4–3.1)

## 2019-12-11 LAB — TSH: TSH: 6.538 u[IU]/mL — ABNORMAL HIGH (ref 0.350–4.500)

## 2019-12-12 LAB — VITAMIN B12: Vitamin B-12: 467 pg/mL (ref 180–914)

## 2019-12-12 LAB — HEPATITIS B SURFACE ANTIGEN: Hepatitis B Surface Ag: NONREACTIVE

## 2019-12-12 LAB — C-REACTIVE PROTEIN: CRP: 1.6 mg/dL — ABNORMAL HIGH (ref <1.0)

## 2019-12-12 LAB — HEPATITIS B CORE ANTIBODY, TOTAL: Hep B Core Total Ab: NONREACTIVE

## 2019-12-12 LAB — HEPATITIS C ANTIBODY: HCV Ab: NONREACTIVE

## 2019-12-14 ENCOUNTER — Other Ambulatory Visit: Payer: Self-pay | Admitting: Hematology and Oncology

## 2019-12-14 ENCOUNTER — Inpatient Hospital Stay: Payer: Managed Care, Other (non HMO) | Attending: Hematology and Oncology

## 2019-12-14 ENCOUNTER — Other Ambulatory Visit: Payer: Self-pay

## 2019-12-14 DIAGNOSIS — M25552 Pain in left hip: Secondary | ICD-10-CM | POA: Diagnosis not present

## 2019-12-14 DIAGNOSIS — K219 Gastro-esophageal reflux disease without esophagitis: Secondary | ICD-10-CM | POA: Insufficient documentation

## 2019-12-14 DIAGNOSIS — I10 Essential (primary) hypertension: Secondary | ICD-10-CM | POA: Diagnosis not present

## 2019-12-14 DIAGNOSIS — R112 Nausea with vomiting, unspecified: Secondary | ICD-10-CM | POA: Diagnosis not present

## 2019-12-14 DIAGNOSIS — R945 Abnormal results of liver function studies: Secondary | ICD-10-CM | POA: Diagnosis not present

## 2019-12-14 DIAGNOSIS — D7589 Other specified diseases of blood and blood-forming organs: Secondary | ICD-10-CM | POA: Diagnosis not present

## 2019-12-14 DIAGNOSIS — F17210 Nicotine dependence, cigarettes, uncomplicated: Secondary | ICD-10-CM | POA: Insufficient documentation

## 2019-12-14 DIAGNOSIS — M549 Dorsalgia, unspecified: Secondary | ICD-10-CM | POA: Diagnosis not present

## 2019-12-14 DIAGNOSIS — R7989 Other specified abnormal findings of blood chemistry: Secondary | ICD-10-CM | POA: Insufficient documentation

## 2019-12-14 DIAGNOSIS — Z79899 Other long term (current) drug therapy: Secondary | ICD-10-CM | POA: Diagnosis not present

## 2019-12-14 LAB — T4, FREE: Free T4: 1.05 ng/dL (ref 0.61–1.12)

## 2019-12-18 LAB — VITAMIN B1: Vitamin B1 (Thiamine): 155.7 nmol/L (ref 66.5–200.0)

## 2019-12-18 LAB — HEMOCHROMATOSIS DNA-PCR(C282Y,H63D)

## 2019-12-29 NOTE — Progress Notes (Signed)
[facility]  [address] [place], [place] [address] Phone: [phone]  Fax: [phone]  Telemedicine Office Visit:  12/31/2019  Referring physician: Reubin Milan, MD  I connected with Lindsay Mack on 12/31/2019 at 1:51 PM by videoconferencing and verified that I was speaking with the correct person using 2 identifiers.  The patient was at home.  I discussed the limitations, risk, security and privacy concerns of performing an evaluation and management service by videoconferencing and the availability of in person appointments.  I also discussed with the patient that there may be a patient responsible charge related to this service.  The patient expressed understanding and agreed to proceed.   Chief Complaint: Lindsay Mack is a 39 y.o. female with an abnormal LFTs and elevated ferritin who is seen for review of work-up and discussion regarding direction of therapy.   HPI: The patient was last seen in the hematology clinic on 12/11/2019 for initial consultultation.  AST and ALT had been elevated since 11/08/2017.  She denied any new medications or herbal products.  She drank 1-2 beers/day.  Ferritin  was 1,583 on 11/18/2019.  Symptomatically, she felt "fine". Exam revealed no hepatomegaly.  Work up revealed a hematocrit 42.7, hemoglobin 15.0, MCV 104.4, platelets 382,000, WBC 12,900 (ANC 9,700). Ferritin was 1,253 with iron saturation 20% and a TIBC of 353. Vitamin B1 was 155.7. Vitamin B12 467 and folate 11.2.  Retic was 2.5%. TSH was 6.538 (high). CRP was 1.6 (<1.0) and sed rate 28 (0-20). Hepatitis C antibody, hepatitis B core total antibody and hepatitis B surface antigen were non-reactive.   Labs on 12/14/2019 showed free T4 of 1.05 (normal).  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 C282Y and S65C were negative.  During the interim, she has felt good. Her symptoms are unchanged. She has no new complaints today. Patient agreed to a  GI referral.    Past Medical History:  Diagnosis Date  . Hypertension     Past Surgical History:  Procedure Laterality Date  . NO PAST SURGERIES      Family History  Adopted: Yes  Family history unknown: Yes    Social History:  reports that she has been smoking cigarettes. She has a 9.00 pack-year smoking history. She has never used smokeless tobacco. She reports that she does not drink alcohol or use drugs. She smokes 1/2 ppd intermittently since she was 39 yrs old.  She drinks 1-2 beers every day. She occasionally drink hard liquor. She works at [employer]. The patient is alone today.  Participants in the patient's visit and their role in the encounter included the patient and Delano Metz, RN, today.  The intake visit was provided by Delano Metz, RN.  Allergies:  Allergies  Allergen Reactions  . Lac Bovis Other (See Comments)    Other Reaction: GI UPSET    Current Medications: Current Outpatient Medications  Medication Sig Dispense Refill  . amLODipine (NORVASC) 5 MG tablet TAKE 1 TABLET (5 MG TOTAL) BY MOUTH ONCE DAILY. 90 tablet 4  . OMEPRAZOLE PO Take 5 mg by mouth daily.    . valACYclovir (VALTREX) 1000 MG tablet TAKE 1 TABLET BY MOUTH EVERY DAY 30 tablet 5   No current facility-administered medications for this visit.    Review of Systems  Constitutional: Negative.  Negative for chills, diaphoresis, fever, malaise/fatigue and weight loss.       Feels good.  HENT: Negative.  Negative for congestion, ear pain, hearing loss,  nosebleeds, sinus pain and sore throat.   Eyes: Negative.  Negative for blurred vision and double vision.  Respiratory: Negative.  Negative for cough, sputum production, shortness of breath and wheezing.   Cardiovascular: Negative.  Negative for chest pain, palpitations and leg swelling.  Gastrointestinal: Positive for nausea (improved after taking omeprazole). Negative for abdominal pain, blood in stool, constipation,  diarrhea, heartburn, melena and vomiting.  Genitourinary: Negative.  Negative for dysuria, frequency and urgency.  Musculoskeletal: Positive for back pain and myalgias (left hip pain). Negative for falls and neck pain.  Skin: Negative.  Negative for itching and rash.  Neurological: Negative.  Negative for dizziness, tingling, sensory change, focal weakness, weakness and headaches.  Endo/Heme/Allergies: Negative.  Does not bruise/bleed easily.  Psychiatric/Behavioral: Negative.  Negative for depression and memory loss. The patient is not nervous/anxious and does not have insomnia.   All other systems reviewed and are negative.  Performance status (ECOG):  0  Physical Exam  Constitutional: She is oriented to person, place, and time. She appears well-developed and well-nourished. No distress.  HENT:  Head: Normocephalic and atraumatic.  Dark curly hair.  Eyes: Conjunctivae and EOM are normal. No scleral icterus.  Glasses.  Brown eyes.  Neurological: She is alert and oriented to person, place, and time.  Skin: She is not diaphoretic.  Psychiatric: She has a normal mood and affect. Her behavior is normal. Judgment and thought content normal.  Nursing note reviewed.   No visits with results within 3 Day(s) from this visit.  Latest known visit with results is:  Appointment on 12/14/2019  Component Date Value Ref Range Status  . Free T4 12/14/2019 1.05  0.61 - 1.12 ng/dL Final   Comment: (NOTE) Biotin ingestion may interfere with free T4 tests. If the results are inconsistent with the TSH level, previous test results, or the clinical presentation, then consider biotin interference. If needed, order repeat testing after stopping biotin. Performed at [facility], [address], [place], [place] [address]   . DNA Mutation Analysis 12/14/2019 Comment   Final   Comment: (NOTE) Result: CARRIER Single mutation (H63D) identified Interpretation: This patient's sample was analyzed  for the hereditary hemochromatosis (HH) mutations C282Y, H63D, and S65C.  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  Results for C282Y and S65C were negative. This person is most likely an unaffected carrier. Approximately 1 in 9 Caucasians are carriers of HH.  The mutations analyzed by [facility] are most common in the Caucasian population. Because this panel does not identify rare HH mutations or HH mutations found in other ethnic groups, there are a small number of people who may have a single copy of H63D who are actually affected. The diagnosis of HH should include clinical findings and other test results, such as transferrin-iron saturation and/or serum ferritin studies and/or liver biopsy.  HH is inherited in a recessive manner. Should this individual have children with a partner who is also a carrier for [facility], there is a 25% chance per offspring that he/she is aff                          ected.  Genetic counseling and HH molecular testing are recommended for at-risk family members. Methodology: DNA Analysis of the HFE gene was performed by PCR amplification followed by restriction enzyme digestion analyses. Reference: Justice Rocher and Walker AP. (2000). Genet Test 4:97-101. Cogswell ME et al. (1999). AM J Prev Med 16:134-140. Bomford A (2002). Lancet  360(9346):1673-81. Dorina Hoyer et al. (2002). Blood Cells, Molecules. and Diseases.  29(3):418-432. Imperatore G et al. (2003). Genet Med. 5(1):1-8. Cogswell ME et al. (2003). Genet Med. 5(4):304-10. This test was developed and its performance characteristics determined by [facility]. It has not been cleared or approved by the Food and Drug Administration. Genetic counselors are available for health care providers to discuss results at 1-800-345-GENE. Vincenza Hews, PhD, [facility] Ernestene Mention, PhD, [facility] Gillian Shields, PhD, [facility] Manya Silvas, PhD, [facility] Bonnielee Haff, PhD, [facility] Nicholas Lose                          y,  PhD, [facility] Performed At: [facility] [address] [place], [place] [id number] Maurine Simmering MDPhD NI:[id number]     Assessment:  Lindsay Mack is a 39 y.o. female with elevated liver function tests and elevated ferritin.  AST and ALT have been elevated since 11/08/2017.  She denies any new medications or herbal products.  She drinks 1-2 beers/day.  Hemochromatosis assay on 12/14/2019 revealed a single mutation of H63D.  Ferritin has been followed:  444 on 11/08/2017, 1583 on 11/18/2019, and 1253 on 12/11/2019.  Additional labs:  Hepatitis B surface antigen and hepatitis C antibody were negative on 11/08/2017.  Cholesterol 257, triglycerides 213, and LDL 162 on 11/18/2019.  Work up on 12/11/2019 revelaed a hematocrit 42.7, hemoglobin 15.0, MCV 104.4, platelets 382,000, WBC 12,900 (ANC 9,700).  Ferritin was 1,253 with iron saturation 20% and a TIBC of 353.  Normal studies included:  vitamin B1, vitamin B12, folate 11.2, hepatitis C antibody, hepatitis B core total antibody and hepatitis B surface antigen.  Retic was 2.5%. TSH was 6.538 (high) with a free T4 of 1.05 (normal) on 12/14/2019. CRP was 1.6 (<1.0) and sed rate 28 (0-20).    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  C282Y and S65C were negative.  She has had progressive macrocytosis since 11/08/2017.  MCV was 104 on 11/18/2019.  Symptomatically, she denies any complaint.   Plan: 1.   Review work up. 2.   Elevated ferritin and LFTs             Etiology of elevated ferritin appears secondary to an acute phase reactant.  Ferritin is 1253 with a normal iron saturation.  Sed rate and CRP are mildy elevated.             Normal iron saturation argues against hemochromatosis.   Family history is unknown.             Hemochomatosis assay revealed a carrier state (1 copy of H63D).   Typically does not manifest disease unless a rare mutation is also present (not tested).  Etiology  of elevated ferritin likely associated with increased liver function tests.                         She notes drinking 1 - 2 beers/day.             Referral to GI for further evaluation. 3.   Macrocytic RBC indices             RBC macrocytosis increasing over past 3 years.             No medications implicated.  Doubt MDS.               She drinks alcohol daily,  although the amount seems small.             Etiology felt secondary to liver disease.   B12, folate, and B1 were normal. 4.   Consult GI (Dr Allen Norris). 5.   RTC in 6 weeks for MD assessment and labs (CBC with diff, LFTs, ferritin).  I discussed the assessment and treatment plan with the patient.  The patient was provided an opportunity to ask questions and all were answered.  The patient agreed with the plan and demonstrated an understanding of the instructions.  The patient was advised to call back if the symptoms worsen or if the condition fails to improve as anticipated.   Lequita Asal, MD, PhD    12/31/2019, 1:51 PM  I, Selena Batten, am acting as scribe for [employer]. Mike Gip, MD, PhD.  I, Rylen Hou C. Mike Gip, MD, have reviewed the above documentation for accuracy and completeness, and I agree with the above.

## 2019-12-30 ENCOUNTER — Other Ambulatory Visit: Payer: Self-pay

## 2019-12-30 ENCOUNTER — Encounter: Payer: Self-pay | Admitting: Hematology and Oncology

## 2019-12-30 NOTE — Progress Notes (Signed)
No new changes noted today. The patient name and DOB has been verified by phone today. 

## 2019-12-31 ENCOUNTER — Inpatient Hospital Stay (HOSPITAL_BASED_OUTPATIENT_CLINIC_OR_DEPARTMENT_OTHER): Payer: Managed Care, Other (non HMO) | Admitting: Hematology and Oncology

## 2019-12-31 ENCOUNTER — Encounter: Payer: Self-pay | Admitting: Hematology and Oncology

## 2019-12-31 DIAGNOSIS — R7989 Other specified abnormal findings of blood chemistry: Secondary | ICD-10-CM

## 2019-12-31 DIAGNOSIS — D7589 Other specified diseases of blood and blood-forming organs: Secondary | ICD-10-CM

## 2020-01-06 ENCOUNTER — Encounter: Payer: Self-pay | Admitting: *Deleted

## 2020-02-11 ENCOUNTER — Inpatient Hospital Stay: Payer: 59 | Attending: Hematology and Oncology | Admitting: Hematology and Oncology

## 2020-02-11 ENCOUNTER — Inpatient Hospital Stay: Payer: 59

## 2020-04-01 ENCOUNTER — Other Ambulatory Visit: Payer: Self-pay | Admitting: Internal Medicine

## 2020-04-01 NOTE — Telephone Encounter (Signed)
Requested Prescriptions  Pending Prescriptions Disp Refills  . amLODipine (NORVASC) 5 MG tablet [Pharmacy Med Name: AMLODIPINE BESYLATE 5 MG TAB] 30 tablet 14    Sig: TAKE 1 TABLET BY MOUTH EVERY DAY     Cardiovascular:  Calcium Channel Blockers Failed - 04/01/2020  1:18 AM      Failed - Last BP in normal range    BP Readings from Last 1 Encounters:  12/11/19 (!) 153/97         Passed - Valid encounter within last 6 months    Recent Outpatient Visits          4 months ago Annual physical exam   [facility] Reubin Milan, MD   1 year ago Annual physical exam   [facility] Reubin Milan, MD   2 years ago Annual physical exam   [facility] Reubin Milan, MD   3 years ago Essential hypertension   [facility] Reubin Milan, MD   3 years ago Essential hypertension   [facility] Reubin Milan, MD      Future Appointments            In 7 months Judithann Graves Nyoka Cowden, MD [facility], [facility]

## 2020-04-30 ENCOUNTER — Other Ambulatory Visit: Payer: Self-pay | Admitting: Internal Medicine

## 2020-04-30 DIAGNOSIS — B009 Herpesviral infection, unspecified: Secondary | ICD-10-CM

## 2020-04-30 NOTE — Telephone Encounter (Signed)
Requested Prescriptions  Pending Prescriptions Disp Refills  . valACYclovir (VALTREX) 1000 MG tablet [Pharmacy Med Name: VALACYCLOVIR HCL 1 GRAM TABLET] 90 tablet 1    Sig: TAKE 1 TABLET BY MOUTH EVERY DAY     Antimicrobials:  Antiviral Agents - Anti-Herpetic Passed - 04/30/2020  9:40 AM      Passed - Valid encounter within last 12 months    Recent Outpatient Visits          5 months ago Annual physical exam   [facility] Reubin Milan, MD   1 year ago Annual physical exam   [facility] Reubin Milan, MD   2 years ago Annual physical exam   [facility] Reubin Milan, MD   3 years ago Essential hypertension   [facility] Reubin Milan, MD   3 years ago Essential hypertension   [facility] Reubin Milan, MD      Future Appointments            In 6 months Judithann Graves Nyoka Cowden, MD [facility], [facility]

## 2020-11-18 ENCOUNTER — Encounter: Payer: Managed Care, Other (non HMO) | Admitting: Internal Medicine

## 2020-11-20 ENCOUNTER — Other Ambulatory Visit: Payer: Self-pay | Admitting: Internal Medicine

## 2020-11-20 DIAGNOSIS — B009 Herpesviral infection, unspecified: Secondary | ICD-10-CM

## 2020-11-20 NOTE — Telephone Encounter (Signed)
Requested Prescriptions  Pending Prescriptions Disp Refills  . valACYclovir (VALTREX) 1000 MG tablet [Pharmacy Med Name: VALACYCLOVIR HCL 1 GRAM TABLET] 90 tablet 0    Sig: TAKE 1 TABLET BY MOUTH EVERY DAY     Antimicrobials:  Antiviral Agents - Anti-Herpetic Failed - 11/20/2020  9:25 AM      Failed - Valid encounter within last 12 months    Recent Outpatient Visits          1 year ago Annual physical exam   [facility] Reubin Milan, MD   2 years ago Annual physical exam   [facility] Reubin Milan, MD   3 years ago Annual physical exam   [facility] Reubin Milan, MD   3 years ago Essential hypertension   [facility] Reubin Milan, MD   3 years ago Essential hypertension   [facility] Reubin Milan, MD      Future Appointments            In 2 days Reubin Milan, MD [facility], [facility]

## 2020-11-22 ENCOUNTER — Encounter: Payer: Managed Care, Other (non HMO) | Admitting: Internal Medicine

## 2020-11-23 ENCOUNTER — Other Ambulatory Visit: Payer: Self-pay

## 2020-11-23 ENCOUNTER — Ambulatory Visit: Payer: 59 | Admitting: Internal Medicine

## 2020-11-23 ENCOUNTER — Encounter: Payer: Self-pay | Admitting: Internal Medicine

## 2020-11-23 VITALS — BP 138/90 | HR 81 | Temp 98.0°F | Ht 64.0 in | Wt 179.0 lb

## 2020-11-23 DIAGNOSIS — I10 Essential (primary) hypertension: Secondary | ICD-10-CM | POA: Diagnosis not present

## 2020-11-23 DIAGNOSIS — Z0000 Encounter for general adult medical examination without abnormal findings: Secondary | ICD-10-CM | POA: Diagnosis not present

## 2020-11-23 DIAGNOSIS — R7989 Other specified abnormal findings of blood chemistry: Secondary | ICD-10-CM | POA: Diagnosis not present

## 2020-11-23 DIAGNOSIS — Z148 Genetic carrier of other disease: Secondary | ICD-10-CM | POA: Insufficient documentation

## 2020-11-23 DIAGNOSIS — E785 Hyperlipidemia, unspecified: Secondary | ICD-10-CM

## 2020-11-23 NOTE — Progress Notes (Signed)
Date:  11/23/2020   Name:  Lindsay Mack   DOB:  11-02-1981   MRN:  [id number]   Chief Complaint: Annual Exam (Breast exam no pap)  Lindsay Mack is a 40 y.o. female who presents today for her Complete Annual Exam. She feels well. She reports exercising none. She reports she is sleeping poorly. Breast complaints none.  Mammogram: due September Pap smear: 10/2017 neg with co-testing   Immunization History  Administered Date(s) Administered  . Meningococcal Conjugate 10/29/2014  . Tdap 11/11/2018    Hypertension This is a chronic problem. The problem is controlled. Pertinent negatives include no chest pain, headaches, palpitations or shortness of breath. Past treatments include calcium channel blockers. The current treatment provides significant improvement. There are no compliance problems.    Elevated LFTs/ferritin - worked up by Hematology for ferritin felt to be acute phase reactant.  Recommended further evaluation by GI. Referral placed but unable to contact patient letter sent.  Lab Results  Component Value Date   CREATININE 0.70 11/18/2019   BUN 9 11/18/2019   NA 139 11/18/2019   K 4.2 11/18/2019   CL 99 11/18/2019   CO2 24 11/18/2019   Lab Results  Component Value Date   CHOL 257 (H) 11/18/2019   HDL 56 11/18/2019   LDLCALC 162 (H) 11/18/2019   TRIG 213 (H) 11/18/2019   CHOLHDL 4.6 (H) 11/18/2019   Lab Results  Component Value Date   TSH 6.538 (H) 12/11/2019   No results found for: HGBA1C Lab Results  Component Value Date   WBC 12.9 (H) 12/11/2019   HGB 15.0 12/11/2019   HCT 42.7 12/11/2019   MCV 104.4 (H) 12/11/2019   PLT 382 12/11/2019   Lab Results  Component Value Date   ALT 193 (H) 11/18/2019   AST 193 (H) 11/18/2019   ALKPHOS 138 (H) 11/18/2019   BILITOT 0.5 11/18/2019     Review of Systems  Constitutional: Negative for chills, fatigue and fever.  HENT: Negative for congestion, hearing loss, tinnitus, trouble swallowing and voice change.    Eyes: Negative for visual disturbance.  Respiratory: Negative for cough, chest tightness, shortness of breath and wheezing.   Cardiovascular: Negative for chest pain, palpitations and leg swelling.  Gastrointestinal: Negative for abdominal pain, constipation, diarrhea and vomiting.  Endocrine: Negative for polydipsia and polyuria.  Genitourinary: Negative for dysuria, frequency, genital sores, vaginal bleeding and vaginal discharge.  Musculoskeletal: Negative for arthralgias, gait problem and joint swelling.  Skin: Negative for color change and rash.  Neurological: Negative for dizziness, tremors, light-headedness and headaches.  Hematological: Negative for adenopathy. Does not bruise/bleed easily.  Psychiatric/Behavioral: Negative for dysphoric mood and sleep disturbance. The patient is not nervous/anxious.     Patient Active Problem List   Diagnosis Date Noted  . 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 Elevated TSH 12/14/2019  . Elevated ferritin 12/11/2019  . Macrocytosis 12/11/2019  . Mild hyperlipidemia 11/11/2018  . Tobacco use disorder 11/11/2018  . Elevated liver function tests 01/23/2017  . Scoliosis of lumbar spine 01/23/2017  . Awareness of heartbeats 12/14/2016  . Essential hypertension 12/14/2016  . Herpes simplex type 2 infection 12/14/2016    Allergies  Allergen Reactions  . Lac Bovis Other (See Comments)    Other Reaction: GI UPSET    Past Surgical History:  Procedure Laterality Date  . NO PAST SURGERIES      Social History   Tobacco Use  . Smoking status: Current Every Day Smoker  Packs/day: 0.50    Years: 18.00    Pack years: 9.00    Types: Cigarettes  . Smokeless tobacco: Never Used  Vaping Use  . Vaping Use: Never used  Substance Use Topics  . Alcohol use: No    Alcohol/week: 2.0 standard drinks    Types: 2 Standard drinks or equivalent per week  . Drug use: No     Medication list has been reviewed and  updated.  Current Meds  Medication Sig  . amLODipine (NORVASC) 5 MG tablet TAKE 1 TABLET BY MOUTH EVERY DAY  . OMEPRAZOLE PO Take 5 mg by mouth daily.  . valACYclovir (VALTREX) 1000 MG tablet TAKE 1 TABLET BY MOUTH EVERY DAY    PHQ 2/9 Scores 11/23/2020 11/18/2019 11/11/2018 11/08/2017  PHQ - 2 Score 0 0 0 0  PHQ- 9 Score 0 0 - -    GAD 7 : Generalized Anxiety Score 11/23/2020  Nervous, Anxious, on Edge 0  Control/stop worrying 0  Worry too much - different things 0  Trouble relaxing 0  Restless 0  Easily annoyed or irritable 0  Afraid - awful might happen 0  Total GAD 7 Score 0    BP Readings from Last 3 Encounters:  11/23/20 (!) 138/92  12/11/19 (!) 153/97  11/18/19 116/82    Physical Exam Vitals and nursing note reviewed.  Constitutional:      General: She is not in acute distress.    Appearance: She is well-developed.  HENT:     Head: Normocephalic and atraumatic.     Right Ear: Tympanic membrane and ear canal normal.     Left Ear: Tympanic membrane and ear canal normal.     Nose:     Right Sinus: No maxillary sinus tenderness.     Left Sinus: No maxillary sinus tenderness.  Eyes:     General: No scleral icterus.       Right eye: No discharge.        Left eye: No discharge.     Conjunctiva/sclera: Conjunctivae normal.  Neck:     Thyroid: No thyromegaly.     Vascular: No carotid bruit.  Cardiovascular:     Rate and Rhythm: Normal rate and regular rhythm.     Pulses: Normal pulses.     Heart sounds: Normal heart sounds.  Pulmonary:     Effort: Pulmonary effort is normal. No respiratory distress.     Breath sounds: No wheezing.  Chest:  Breasts:     Right: No mass, nipple discharge, skin change or tenderness.     Left: No mass, nipple discharge, skin change or tenderness.    Abdominal:     General: Bowel sounds are normal.     Palpations: Abdomen is soft.     Tenderness: There is no abdominal tenderness.  Musculoskeletal:        General: Normal range  of motion.     Cervical back: Normal range of motion. No erythema.     Right lower leg: No edema.     Left lower leg: No edema.  Lymphadenopathy:     Cervical: No cervical adenopathy.  Skin:    General: Skin is warm and dry.     Findings: No rash.  Neurological:     Mental Status: She is alert and oriented to person, place, and time.     Cranial Nerves: No cranial nerve deficit.     Sensory: No sensory deficit.     Deep Tendon Reflexes: Reflexes are normal and  symmetric.  Psychiatric:        Attention and Perception: Attention normal.        Mood and Affect: Mood normal.     Wt Readings from Last 3 Encounters:  11/23/20 179 lb (81.2 kg)  12/11/19 186 lb 4.6 oz (84.5 kg)  11/18/19 183 lb (83 kg)    BP (!) 138/92   Pulse 81   Temp 98 F (36.7 C) (Oral)   Ht 5\' 4"  (1.626 m)   Wt 179 lb (81.2 kg)   LMP 10/27/2020   SpO2 97%   BMI 30.73 kg/m   Assessment and Plan: 1. Annual physical exam Exam is normal except for weight. Encourage regular exercise and appropriate dietary changes. Has lost a few pounds recently - Mammogram next year Pap in 2023  2. Essential hypertension Clinically stable exam with fairly well controlled BP on amlodipine. Tolerating medications without side effects at this time. Pt to continue current regimen and low sodium diet; benefits of regular exercise as able discussed. Pt to monitor at home - CBC with Differential/Platelet - Comprehensive metabolic panel  3. Mild hyperlipidemia - Lipid panel  4. Elevated TSH - TSH + free T4  5. Elevated liver function tests May need GI referral if persistently elevated - Comprehensive metabolic panel   Partially dictated using Dragon software. Any errors are unintentional.  2024, MD [facility]  11/23/2020

## 2020-11-23 NOTE — Patient Instructions (Signed)
Check BP several times over the next few months to confirm that it is less than 140/90  Look out for a GI referral is the labs are still abnormal.  Health Maintenance, Female Adopting a healthy lifestyle and getting preventive care are important in promoting health and wellness. Ask your health care provider about:  The right schedule for you to have regular tests and exams.  Things you can do on your own to prevent diseases and keep yourself healthy. What should I know about diet, weight, and exercise? Eat a healthy diet  Eat a diet that includes plenty of vegetables, fruits, low-fat dairy products, and lean protein.  Do not eat a lot of foods that are high in solid fats, added sugars, or sodium.   Maintain a healthy weight Body mass index (BMI) is used to identify weight problems. It estimates body fat based on height and weight. Your health care provider can help determine your BMI and help you achieve or maintain a healthy weight. Get regular exercise Get regular exercise. This is one of the most important things you can do for your health. Most adults should:  Exercise for at least 150 minutes each week. The exercise should increase your heart rate and make you sweat (moderate-intensity exercise).  Do strengthening exercises at least twice a week. This is in addition to the moderate-intensity exercise.  Spend less time sitting. Even light physical activity can be beneficial. Watch cholesterol and blood lipids Have your blood tested for lipids and cholesterol at 40 years of age, then have this test every 5 years. Have your cholesterol levels checked more often if:  Your lipid or cholesterol levels are high.  You are older than 40 years of age.  You are at high risk for heart disease. What should I know about cancer screening? Depending on your health history and family history, you may need to have cancer screening at various ages. This may include screening for:  Breast  cancer.  Cervical cancer.  Colorectal cancer.  Skin cancer.  Lung cancer. What should I know about heart disease, diabetes, and high blood pressure? Blood pressure and heart disease  High blood pressure causes heart disease and increases the risk of stroke. This is more likely to develop in people who have high blood pressure readings, are of African descent, or are overweight.  Have your blood pressure checked: ? Every 3-5 years if you are 40 years of age. ? Every year if you are 40 years old or older. Diabetes Have regular diabetes screenings. This checks your fasting blood sugar level. Have the screening done:  Once every three years after age 40 if you are at a normal weight and have a low risk for diabetes.  More often and at a younger age if you are overweight or have a high risk for diabetes. What should I know about preventing infection? Hepatitis B If you have a higher risk for hepatitis B, you should be screened for this virus. Talk with your health care provider to find out if you are at risk for hepatitis B infection. Hepatitis C Testing is recommended for:  Everyone born from [birth year] through 1965.  Anyone with known risk factors for hepatitis C. Sexually transmitted infections (STIs)  Get screened for STIs, including gonorrhea and chlamydia, if: ? You are sexually active and are younger than 40 years of age. ? You are older than 40 years of age and your health care provider tells you that you are at risk  at risk for this type of infection. ? Your sexual activity has changed since you were last screened, and you are at increased risk for chlamydia or gonorrhea. Ask your health care provider if you are at risk.  Ask your health care provider about whether you are at high risk for HIV. Your health care provider may recommend a prescription medicine to help prevent HIV infection. If you choose to take medicine to prevent HIV, you should first get tested for HIV. You should  then be tested every 3 months for as long as you are taking the medicine. Pregnancy  If you are about to stop having your period (premenopausal) and you may become pregnant, seek counseling before you get pregnant.  Take 400 to 800 micrograms (mcg) of folic acid every day if you become pregnant.  Ask for birth control (contraception) if you want to prevent pregnancy. Osteoporosis and menopause Osteoporosis is a disease in which the bones lose minerals and strength with aging. This can result in bone fractures. If you are 65 years old or older, or if you are at risk for osteoporosis and fractures, ask your health care provider if you should:  Be screened for bone loss.  Take a calcium or vitamin D supplement to lower your risk of fractures.  Be given hormone replacement therapy (HRT) to treat symptoms of menopause. Follow these instructions at home: Lifestyle  Do not use any products that contain nicotine or tobacco, such as cigarettes, e-cigarettes, and chewing tobacco. If you need help quitting, ask your health care provider.  Do not use street drugs.  Do not share needles.  Ask your health care provider for help if you need support or information about quitting drugs. Alcohol use  Do not drink alcohol if: ? Your health care provider tells you not to drink. ? You are pregnant, may be pregnant, or are planning to become pregnant.  If you drink alcohol: ? Limit how much you use to 0-1 drink a day. ? Limit intake if you are breastfeeding.  Be aware of how much alcohol is in your drink. In the [place] one drink equals one 12 oz bottle of beer (355 mL), one 5 oz glass of wine (148 mL), or one 1 oz glass of hard liquor (44 mL). General instructions  Schedule regular health, dental, and eye exams.  Stay current with your vaccines.  Tell your health care provider if: ? You often feel depressed. ? You have ever been abused or do not feel safe at home. Summary  Adopting a  healthy lifestyle and getting preventive care are important in promoting health and wellness.  Follow your health care provider's instructions about healthy diet, exercising, and getting tested or screened for diseases.  Follow your health care provider's instructions on monitoring your cholesterol and blood pressure. This information is not intended to replace advice given to you by your health care provider. Make sure you discuss any questions you have with your health care provider. Document Revised: 10/22/2018 Document Reviewed: 10/22/2018 Elsevier Patient Education  2021 [employer]vier Inc.  

## 2020-11-24 ENCOUNTER — Other Ambulatory Visit: Payer: Self-pay | Admitting: Internal Medicine

## 2020-11-24 DIAGNOSIS — R7989 Other specified abnormal findings of blood chemistry: Secondary | ICD-10-CM

## 2020-11-24 LAB — CBC WITH DIFFERENTIAL/PLATELET
Basophils Absolute: 0.1 10*3/uL (ref 0.0–0.2)
Basos: 1 %
EOS (ABSOLUTE): 0.2 10*3/uL (ref 0.0–0.4)
Eos: 3 %
Hematocrit: 42.6 % (ref 34.0–46.6)
Hemoglobin: 15.2 g/dL (ref 11.1–15.9)
Immature Grans (Abs): 0 10*3/uL (ref 0.0–0.1)
Immature Granulocytes: 0 %
Lymphocytes Absolute: 1.2 10*3/uL (ref 0.7–3.1)
Lymphs: 18 %
MCH: 36.7 pg — ABNORMAL HIGH (ref 26.6–33.0)
MCHC: 35.7 g/dL (ref 31.5–35.7)
MCV: 103 fL — ABNORMAL HIGH (ref 79–97)
Monocytes Absolute: 0.5 10*3/uL (ref 0.1–0.9)
Monocytes: 7 %
Neutrophils Absolute: 5 10*3/uL (ref 1.4–7.0)
Neutrophils: 71 %
Platelets: 267 10*3/uL (ref 150–450)
RBC: 4.14 x10E6/uL (ref 3.77–5.28)
RDW: 11.1 % — ABNORMAL LOW (ref 11.7–15.4)
WBC: 6.9 10*3/uL (ref 3.4–10.8)

## 2020-11-24 LAB — LIPID PANEL
Chol/HDL Ratio: 4.5 ratio — ABNORMAL HIGH (ref 0.0–4.4)
Cholesterol, Total: 267 mg/dL — ABNORMAL HIGH (ref 100–199)
HDL: 59 mg/dL (ref >39)
LDL Chol Calc (NIH): 167 mg/dL — ABNORMAL HIGH (ref 0–99)
Triglycerides: 224 mg/dL — ABNORMAL HIGH (ref 0–149)
VLDL Cholesterol Cal: 41 mg/dL — ABNORMAL HIGH (ref 5–40)

## 2020-11-24 LAB — COMPREHENSIVE METABOLIC PANEL
ALT: 105 IU/L — ABNORMAL HIGH (ref 0–32)
AST: 101 IU/L — ABNORMAL HIGH (ref 0–40)
Albumin/Globulin Ratio: 1.9 (ref 1.2–2.2)
Albumin: 4.7 g/dL (ref 3.8–4.8)
Alkaline Phosphatase: 112 IU/L (ref 44–121)
BUN/Creatinine Ratio: 18 (ref 9–23)
BUN: 11 mg/dL (ref 6–20)
Bilirubin Total: 0.6 mg/dL (ref 0.0–1.2)
CO2: 22 mmol/L (ref 20–29)
Calcium: 9.9 mg/dL (ref 8.7–10.2)
Chloride: 99 mmol/L (ref 96–106)
Creatinine, Ser: 0.61 mg/dL (ref 0.57–1.00)
GFR calc Af Amer: 132 mL/min/{1.73_m2} (ref >59)
GFR calc non Af Amer: 115 mL/min/{1.73_m2} (ref >59)
Globulin, Total: 2.5 g/dL (ref 1.5–4.5)
Glucose: 93 mg/dL (ref 65–99)
Potassium: 3.8 mmol/L (ref 3.5–5.2)
Sodium: 138 mmol/L (ref 134–144)
Total Protein: 7.2 g/dL (ref 6.0–8.5)

## 2020-11-24 LAB — TSH+FREE T4
Free T4: 1.16 ng/dL (ref 0.82–1.77)
TSH: 1.92 u[IU]/mL (ref 0.450–4.500)

## 2021-01-20 ENCOUNTER — Other Ambulatory Visit: Payer: Self-pay

## 2021-01-20 ENCOUNTER — Ambulatory Visit (INDEPENDENT_AMBULATORY_CARE_PROVIDER_SITE_OTHER): Payer: 59

## 2021-01-20 ENCOUNTER — Encounter: Payer: Self-pay | Admitting: Emergency Medicine

## 2021-01-20 ENCOUNTER — Ambulatory Visit
Admission: EM | Admit: 2021-01-20 | Discharge: 2021-01-20 | Disposition: A | Discharge status: Home / Self Care | Payer: 59 | Attending: Emergency Medicine | Admitting: Emergency Medicine

## 2021-01-20 DIAGNOSIS — S93401A Sprain of unspecified ligament of right ankle, initial encounter: Secondary | ICD-10-CM

## 2021-01-20 DIAGNOSIS — W1830XA Fall on same level, unspecified, initial encounter: Secondary | ICD-10-CM | POA: Diagnosis not present

## 2021-01-20 NOTE — ED Triage Notes (Signed)
Pt states that she fell a week ago and now has right ankle pain. Pt states that she has been icing and taking ibuprofen. Pt states that her ankle is swollen and discoloration.  Pt states that her ankle is numb at the moment and she has slight throbbing.

## 2021-01-20 NOTE — Discharge Instructions (Addendum)
Wear the cam boot whenever you are up and mobile.  Keep your right foot elevated as much as possible to help reduce the swelling.  You can also apply ice, or utilize moist heat to help improve circulation and deal with the swelling.  Minimal weightbearing with your right foot for the next week.  Use the crutches to help you get around.  After 1 week you can gradually increase the amount of weight you bear based on pain and swelling.  You may need to wear the boot for a total of 4 weeks.  If your pain and swelling are not getting any better after another week you need to see orthopedics.

## 2021-01-20 NOTE — ED Provider Notes (Signed)
[facility]    CSN: [id number] Arrival date & time: 01/20/21  1416      History   Chief Complaint Chief Complaint  Patient presents with  . Fall  . Ankle Pain    HPI Lindsay Mack is a 40 y.o. female.   HPI   40 year old female here for evaluation of right ankle pain.  Patient reports that she was walking down a ramp, missed a step, and fell off the ramp.  She is unsure of the exact distance that she did fall.  She reports that she had pain right away in her right ankle but she has been walking on it.  She also reports that she has been using ice, ibuprofen, and elevation.  Her right ankle is still swollen and red 1 week after the initial injury.  Patient is also complaining of numbness to her ankle and tingling in her toes.  Past Medical History:  Diagnosis Date  . Hypertension     Patient Active Problem List   Diagnosis Date Noted  . 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 Elevated TSH 12/14/2019  . Elevated ferritin 12/11/2019  . Macrocytosis 12/11/2019  . Mild hyperlipidemia 11/11/2018  . Tobacco use disorder 11/11/2018  . Elevated liver function tests 01/23/2017  . Scoliosis of lumbar spine 01/23/2017  . Awareness of heartbeats 12/14/2016  . Essential hypertension 12/14/2016  . Herpes simplex type 2 infection 12/14/2016    Past Surgical History:  Procedure Laterality Date  . NO PAST SURGERIES      OB History   No obstetric history on file.      Home Medications    Prior to Admission medications   Medication Sig Start Date End Date Taking? Authorizing Provider  amLODipine (NORVASC) 5 MG tablet TAKE 1 TABLET BY MOUTH EVERY DAY 04/01/20   Reubin Milan, MD  OMEPRAZOLE PO Take 5 mg by mouth daily.    [provider]  valACYclovir (VALTREX) 1000 MG tablet TAKE 1 TABLET BY MOUTH EVERY DAY 11/20/20   Reubin Milan, MD    Family History Family History  Adopted: Yes  Family history unknown: Yes    Social  History Social History   Tobacco Use  . Smoking status: Current Every Day Smoker    Packs/day: 0.50    Years: 18.00    Pack years: 9.00    Types: Cigarettes  . Smokeless tobacco: Never Used  Vaping Use  . Vaping Use: Never used  Substance Use Topics  . Alcohol use: No    Alcohol/week: 2.0 standard drinks    Types: 2 Standard drinks or equivalent per week  . Drug use: No     Allergies   Lac bovis   Review of Systems Review of Systems  Constitutional: Negative for activity change and fever.  Musculoskeletal: Positive for arthralgias, joint swelling and myalgias.  Skin: Positive for color change.  Neurological: Positive for numbness. Negative for weakness.  Hematological: Negative.   Psychiatric/Behavioral: Negative.      Physical Exam Triage Vital Signs ED Triage Vitals  Enc Vitals Group     BP 01/20/21 1428 (!) 153/105     Pulse Rate 01/20/21 1428 (!) 104     Resp 01/20/21 1428 17     Temp 01/20/21 1428 97.7 F (36.5 C)     Temp Source 01/20/21 1428 Oral     SpO2 01/20/21 1428 99 %     Weight --      Height --  Head Circumference --      Peak Flow --      Pain Score 01/20/21 1426 10     Pain Loc --      Pain Edu? --      Excl. in GC? --    No data found.  Updated Vital Signs BP (!) 153/105 (BP Location: Left Arm)   Pulse (!) 104   Temp 97.7 F (36.5 C) (Oral)   Resp 17   SpO2 99%   Visual Acuity Right Eye Distance:   Left Eye Distance:   Bilateral Distance:    Right Eye Near:   Left Eye Near:    Bilateral Near:     Physical Exam Vitals and nursing note reviewed.  Constitutional:      General: She is not in acute distress.    Appearance: Normal appearance. She is not ill-appearing.  HENT:     Head: Normocephalic and atraumatic.  Musculoskeletal:        General: Swelling, tenderness and signs of injury present. No deformity.  Skin:    General: Skin is warm and dry.     Capillary Refill: Capillary refill takes less than 2 seconds.   Neurological:     General: No focal deficit present.     Mental Status: She is alert and oriented to person, place, and time.     Sensory: Sensory deficit present.     Motor: No weakness.  Psychiatric:        Mood and Affect: Mood normal.        Behavior: Behavior normal.        Thought Content: Thought content normal.        Judgment: Judgment normal.      UC Treatments / Results  Labs (all labs ordered are listed, but only abnormal results are displayed) Labs Reviewed - No data to display  EKG   Radiology DG Ankle Complete Right  Result Date: 01/20/2021 CLINICAL DATA:  Fall a week ago, right ankle pain EXAM: RIGHT ANKLE - COMPLETE 3+ VIEW COMPARISON:  None. FINDINGS: No fracture or dislocation of the right ankle. Joint spaces are preserved. There is diffuse soft tissue edema about the ankle, particularly about the lateral malleolus. IMPRESSION: No fracture or dislocation of the right ankle. There is diffuse soft tissue edema about the ankle, particularly about the lateral malleolus. Electronically Signed   By: Lauralyn Primes M.D.   On: 01/20/2021 15:18    Procedures Procedures (including critical care time)  Medications Ordered in UC Medications - No data to display  Initial Impression / Assessment and Plan / UC Course  I have reviewed the triage vital signs and the nursing notes.  Pertinent labs & imaging results that were available during my care of the patient were reviewed by me and considered in my medical decision making (see chart for details).   Patient is here for evaluation of right ankle pain that has been going on 1 week since she fell walking down a ramp.  Patient presents with marked swelling of her right ankle which is worse on the lateral aspect.  Does not complain of tenderness to compression of lateral medial malleolli.  She reports that she does not feel anything but pressure when that area is palpated.  Patient does have tingling in her toes but she does  have full sensation.  Patient is full range of motion of her toes.  Patient can dorsiflex and plantarflex her foot without pain.  PT and DP pulses  are 2+.  The ankle joint itself is swollen, erythematous, and hot.  No tenderness or ecchymosis to the distal tibia or fibula.  Will obtain radiograph of right ankle.  Right ankle x-rays independently evaluated by me.  Interpretation: There is no evidence of fracture or dislocation of the talar mortise joints.  The distal fibula and tibia are intact.  Soft tissue swelling present.  Awaiting radiology overread. Ideology interpretation concurs with my evaluation of the images.  Will discharge patient home with diagnosis of right ankle sprain.  We will put her in a Cam boot and crutches.  Work note provided  Final Clinical Impressions(s) / UC Diagnoses   Final diagnoses:  Sprain of right ankle, unspecified ligament, initial encounter     Discharge Instructions     Wear the cam boot whenever you are up and mobile.  Keep your right foot elevated as much as possible to help reduce the swelling.  You can also apply ice, or utilize moist heat to help improve circulation and deal with the swelling.  Minimal weightbearing with your right foot for the next week.  Use the crutches to help you get around.  After 1 week you can gradually increase the amount of weight you bear based on pain and swelling.  You may need to wear the boot for a total of 4 weeks.  If your pain and swelling are not getting any better after another week you need to see orthopedics.    ED Prescriptions    None     PDMP not reviewed this encounter.   Becky Augusta, NP 01/20/21 1534

## 2021-01-20 NOTE — ED Notes (Signed)
Patient did not want the crutches.

## 2021-02-26 ENCOUNTER — Other Ambulatory Visit: Payer: Self-pay | Admitting: Internal Medicine

## 2021-02-26 DIAGNOSIS — B009 Herpesviral infection, unspecified: Secondary | ICD-10-CM

## 2021-02-26 NOTE — Telephone Encounter (Signed)
Requested Prescriptions  Pending Prescriptions Disp Refills  . valACYclovir (VALTREX) 1000 MG tablet [Pharmacy Med Name: VALACYCLOVIR HCL 1 GRAM TABLET] 30 tablet 2    Sig: TAKE 1 TABLET BY MOUTH EVERY DAY     Antimicrobials:  Antiviral Agents - Anti-Herpetic Passed - 02/26/2021  4:35 PM      Passed - Valid encounter within last 12 months    Recent Outpatient Visits          3 months ago Annual physical exam   [facility] Reubin Milan, MD   1 year ago Annual physical exam   [facility] Reubin Milan, MD   2 years ago Annual physical exam   [facility] Reubin Milan, MD   3 years ago Annual physical exam   [facility] Reubin Milan, MD   4 years ago Essential hypertension   [place] Reubin Milan, MD      Future Appointments            In 2 months Judithann Graves Nyoka Cowden, MD [facility]

## 2021-04-12 ENCOUNTER — Ambulatory Visit: Payer: Self-pay | Admitting: *Deleted

## 2021-04-12 NOTE — Telephone Encounter (Signed)
Pt reports lower back pain x 1 week. States went to UC last Friday, placed on steroids and muscle relaxer. States pain has worsened.  Pain starts at lower back, radiates to left buttocks, hip, down leg to foot. States left side of thigh numb as well as 2 toes left foot and side of left calf. States left calf cramping "So bad I can hardly walk."  Denies any bowel/bladder issues. Rates pain 8/10. Advised ED, declines. States she would like to hear from Dr. Judithann Graves for referral or "Who I should go to for help, like a chiropractor."  Reiterated need for ED eval. Assured pt NT would route to practice for PCPs review and final disposition. Please advise: [phone]  Reason for Disposition . Unable to walk  Answer Assessment - Initial Assessment Questions 1. ONSET: "When did the pain begin?"      2 weeks ago 2. LOCATION: "Where does it hurt?" (upper, mid or lower back)     Lower back, left leg 3. SEVERITY: "How bad is the pain?"  (e.g., Scale 1-10; mild, moderate, or severe)   - MILD (1-3): doesn't interfere with normal activities    - MODERATE (4-7): interferes with normal activities or awakens from sleep    - SEVERE (8-10): excruciating pain, unable to do any normal activities      8/10 4. PATTERN: "Is the pain constant?" (e.g., yes, no; constant, intermittent)      constant 5. RADIATION: "Does the pain shoot into your legs or elsewhere?"     Down  To buttocks, hio, leg, foot 6. CAUSE:  "What do you think is causing the back pain?"      unsure 7. BACK OVERUSE:  "Any recent lifting of heavy objects, strenuous work or exercise?"     no 8. MEDICATIONS: "What have you taken so far for the pain?" (e.g., nothing, acetaminophen, NSAIDS)     UC, muscle relaxers, steroids 9. NEUROLOGIC SYMPTOMS: "Do you have any weakness, numbness, or problems with bowel/bladder control?"    no 10. OTHER SYMPTOMS: "Do you have any other symptoms?" (e.g., fever, abdominal pain, burning with urination, blood in  urine) no  Protocols used: BACK PAIN-A-AH

## 2021-04-12 NOTE — Telephone Encounter (Signed)
Please review. Pt stated she went to [facility]. Pt stated that they are the ones who gave her steroids and muscle relaxer.

## 2021-04-13 NOTE — Telephone Encounter (Signed)
Called pt told her to go back to [facility]. Pt verbalized understanding.  KP

## 2021-05-23 ENCOUNTER — Encounter: Payer: Self-pay | Admitting: Internal Medicine

## 2021-05-23 ENCOUNTER — Ambulatory Visit: Payer: 59 | Admitting: Internal Medicine

## 2021-05-23 ENCOUNTER — Other Ambulatory Visit: Payer: Self-pay

## 2021-05-23 VITALS — BP 136/88 | HR 100 | Temp 98.2°F | Resp 14 | Ht 64.0 in | Wt 180.0 lb

## 2021-05-23 DIAGNOSIS — M5416 Radiculopathy, lumbar region: Secondary | ICD-10-CM

## 2021-05-23 DIAGNOSIS — I10 Essential (primary) hypertension: Secondary | ICD-10-CM | POA: Diagnosis not present

## 2021-05-23 DIAGNOSIS — R7989 Other specified abnormal findings of blood chemistry: Secondary | ICD-10-CM | POA: Diagnosis not present

## 2021-05-23 MED ORDER — AMLODIPINE BESYLATE 5 MG PO TABS: 5.0000 mg | ORAL_TABLET | Freq: Every day | ORAL | 0 refills | Status: DC

## 2021-05-23 MED ORDER — HYDROCHLOROTHIAZIDE 12.5 MG PO CAPS: 12.5000 mg | ORAL_CAPSULE | Freq: Every day | ORAL | 1 refills | Status: DC

## 2021-05-23 NOTE — Progress Notes (Signed)
Date:  05/23/2021   Name:  Lindsay Mack   DOB:  03/01/1981   MRN:  [id number]   Chief Complaint: Hypertension  Hypertension This is a chronic problem. The problem is uncontrolled (at home 140-150/90). Pertinent negatives include no chest pain, headaches, palpitations or shortness of breath. Past treatments include calcium channel blockers. The current treatment provides significant improvement.  Back Pain This is a new problem. The current episode started 1 to 4 weeks ago. The problem occurs constantly. The problem is unchanged. The pain is present in the lumbar spine. The quality of the pain is described as aching. The pain radiates to the right knee. The pain is moderate. Pertinent negatives include no abdominal pain, chest pain, headaches or weakness. Treatments tried: planning ESI tomorrow.  Elevated liver function tests - she was seen by Hematology for elevated Ferritin.  They recommended GI consult but pt never responded to the scheduler.  Ferritin was felt to be an acute phase reactant as iron levels were normal.   Lab Results  Component Value Date   CREATININE 0.61 11/23/2020   BUN 11 11/23/2020   NA 138 11/23/2020   K 3.8 11/23/2020   CL 99 11/23/2020   CO2 22 11/23/2020   Lab Results  Component Value Date   CHOL 267 (H) 11/23/2020   HDL 59 11/23/2020   LDLCALC 167 (H) 11/23/2020   TRIG 224 (H) 11/23/2020   CHOLHDL 4.5 (H) 11/23/2020   Lab Results  Component Value Date   TSH 1.920 11/23/2020   No results found for: HGBA1C Lab Results  Component Value Date   WBC 6.9 11/23/2020   HGB 15.2 11/23/2020   HCT 42.6 11/23/2020   MCV 103 (H) 11/23/2020   PLT 267 11/23/2020   Lab Results  Component Value Date   ALT 105 (H) 11/23/2020   AST 101 (H) 11/23/2020   ALKPHOS 112 11/23/2020   BILITOT 0.6 11/23/2020     Review of Systems  Constitutional:  Negative for chills, fatigue and unexpected weight change.  HENT:  Negative for nosebleeds.   Eyes:  Negative for  visual disturbance.  Respiratory:  Negative for cough, chest tightness, shortness of breath and wheezing.   Cardiovascular:  Negative for chest pain, palpitations and leg swelling.  Gastrointestinal:  Negative for abdominal pain, constipation and diarrhea.  Musculoskeletal:  Positive for back pain.  Neurological:  Negative for dizziness, weakness, light-headedness and headaches.  Psychiatric/Behavioral:  Negative for dysphoric mood and sleep disturbance. The patient is not nervous/anxious.    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   Elevated TSH 12/14/2019   Elevated ferritin 12/11/2019   Macrocytosis 12/11/2019   Mild hyperlipidemia 11/11/2018   Tobacco use disorder 11/11/2018   Elevated liver function tests 01/23/2017   Scoliosis of lumbar spine 01/23/2017   Awareness of heartbeats 12/14/2016   Essential hypertension 12/14/2016   Herpes simplex type 2 infection 12/14/2016    Allergies  Allergen Reactions   Lactose Diarrhea   Lac Bovis Other (See Comments)    Other Reaction: GI UPSET    Past Surgical History:  Procedure Laterality Date   NO PAST SURGERIES      Social History   Tobacco Use   Smoking status: Every Day    Packs/day: 0.50    Years: 20.00    Pack years: 10.00    Types: Cigarettes   Smokeless tobacco: Never  Vaping Use   Vaping Use:  Never used  Substance Use Topics   Alcohol use: No    Alcohol/week: 2.0 standard drinks    Types: 2 Standard drinks or equivalent per week   Drug use: No     Medication list has been reviewed and updated.  Current Meds  Medication Sig   hydrochlorothiazide (MICROZIDE) 12.5 MG capsule Take 1 capsule (12.5 mg total) by mouth daily.   OMEPRAZOLE PO Take 5 mg by mouth daily.   valACYclovir (VALTREX) 1000 MG tablet TAKE 1 TABLET BY MOUTH EVERY DAY   [DISCONTINUED] amLODipine (NORVASC) 5 MG tablet TAKE 1 TABLET BY MOUTH EVERY DAY    PHQ 2/9 Scores  05/23/2021 11/23/2020 11/18/2019 11/11/2018  PHQ - 2 Score 0 0 0 0  PHQ- 9 Score 0 0 0 -    GAD 7 : Generalized Anxiety Score 05/23/2021 11/23/2020  Nervous, Anxious, on Edge 0 0  Control/stop worrying 0 0  Worry too much - different things 0 0  Trouble relaxing 0 0  Restless 0 0  Easily annoyed or irritable 0 0  Afraid - awful might happen 0 0  Total GAD 7 Score 0 0  Anxiety Difficulty Not difficult at all -    BP Readings from Last 3 Encounters:  05/23/21 136/88  01/20/21 (!) 153/105  11/23/20 138/90    Physical Exam Vitals and nursing note reviewed.  Constitutional:      General: She is not in acute distress.    Appearance: She is well-developed.  HENT:     Head: Normocephalic and atraumatic.  Cardiovascular:     Rate and Rhythm: Normal rate and regular rhythm.     Pulses: Normal pulses.     Heart sounds: No murmur heard. Pulmonary:     Effort: Pulmonary effort is normal. No respiratory distress.     Breath sounds: No wheezing or rhonchi.  Musculoskeletal:     Cervical back: Normal range of motion.     Right lower leg: No edema.     Left lower leg: No edema.  Skin:    General: Skin is warm and dry.     Findings: No rash.  Neurological:     Mental Status: She is alert and oriented to person, place, and time.  Psychiatric:        Mood and Affect: Mood normal.        Behavior: Behavior normal.    Wt Readings from Last 3 Encounters:  05/23/21 180 lb (81.6 kg)  11/23/20 179 lb (81.2 kg)  12/11/19 186 lb 4.6 oz (84.5 kg)    BP 136/88 (BP Location: Right Arm, Patient Position: Sitting, Cuff Size: Normal)   Pulse 100   Temp 98.2 F (36.8 C) (Oral)   Resp 14   Ht 5\' 4"  (1.626 m)   Wt 180 lb (81.6 kg)   LMP 05/19/2021 (Exact Date)   SpO2 96%   BMI 30.90 kg/m   Assessment and Plan: 1. Essential hypertension Not adequately controlled on norvasc. Will add HCTZ and pt will monitor at home. - CBC with Differential/Platelet - hydrochlorothiazide (MICROZIDE) 12.5  MG capsule; Take 1 capsule (12.5 mg total) by mouth daily.  Dispense: 90 capsule; Refill: 1 - amLODipine (NORVASC) 5 MG tablet; Take 1 tablet (5 mg total) by mouth daily.  Dispense: 90 tablet; Refill: 0  2. Elevated liver function tests Repeat labs Discussed need for GI evaluation if still abnormal - the degree of urgency TBD - Comprehensive metabolic panel  3. Elevated ferritin Felt to be acute  phase reactant from liver issues - will repeat today Hematology workup completed - Ferritin  4. Lumbar radiculopathy, right Seeing [facility]   Partially dictated using Animal nutritionist. Any errors are unintentional.  Bari Edward, MD [facility]  05/23/2021

## 2021-05-24 LAB — CBC WITH DIFFERENTIAL/PLATELET
Basophils Absolute: 0.1 10*3/uL (ref 0.0–0.2)
Basos: 1 %
EOS (ABSOLUTE): 0.2 10*3/uL (ref 0.0–0.4)
Eos: 2 %
Hematocrit: 41.5 % (ref 34.0–46.6)
Hemoglobin: 15.1 g/dL (ref 11.1–15.9)
Immature Grans (Abs): 0 10*3/uL (ref 0.0–0.1)
Immature Granulocytes: 0 %
Lymphocytes Absolute: 1.7 10*3/uL (ref 0.7–3.1)
Lymphs: 22 %
MCH: 37.6 pg — ABNORMAL HIGH (ref 26.6–33.0)
MCHC: 36.4 g/dL — ABNORMAL HIGH (ref 31.5–35.7)
MCV: 103 fL — ABNORMAL HIGH (ref 79–97)
Monocytes Absolute: 0.4 10*3/uL (ref 0.1–0.9)
Monocytes: 5 %
Neutrophils Absolute: 5.6 10*3/uL (ref 1.4–7.0)
Neutrophils: 70 %
Platelets: 270 10*3/uL (ref 150–450)
RBC: 4.02 x10E6/uL (ref 3.77–5.28)
RDW: 11.7 % (ref 11.7–15.4)
WBC: 8 10*3/uL (ref 3.4–10.8)

## 2021-05-24 LAB — COMPREHENSIVE METABOLIC PANEL
ALT: 143 IU/L — ABNORMAL HIGH (ref 0–32)
AST: 245 IU/L — ABNORMAL HIGH (ref 0–40)
Albumin/Globulin Ratio: 2 (ref 1.2–2.2)
Albumin: 4.7 g/dL (ref 3.8–4.8)
Alkaline Phosphatase: 133 IU/L — ABNORMAL HIGH (ref 44–121)
BUN/Creatinine Ratio: 11 (ref 9–23)
BUN: 7 mg/dL (ref 6–20)
Bilirubin Total: 0.6 mg/dL (ref 0.0–1.2)
CO2: 21 mmol/L (ref 20–29)
Calcium: 9.5 mg/dL (ref 8.7–10.2)
Chloride: 100 mmol/L (ref 96–106)
Creatinine, Ser: 0.65 mg/dL (ref 0.57–1.00)
Globulin, Total: 2.4 g/dL (ref 1.5–4.5)
Glucose: 92 mg/dL (ref 65–99)
Potassium: 4.1 mmol/L (ref 3.5–5.2)
Sodium: 141 mmol/L (ref 134–144)
Total Protein: 7.1 g/dL (ref 6.0–8.5)
eGFR: 115 mL/min/{1.73_m2} (ref >59)

## 2021-05-24 LAB — FERRITIN: Ferritin: 1097 ng/mL — ABNORMAL HIGH (ref 15–150)

## 2021-05-27 ENCOUNTER — Other Ambulatory Visit: Payer: Self-pay | Admitting: Internal Medicine

## 2021-05-27 DIAGNOSIS — B009 Herpesviral infection, unspecified: Secondary | ICD-10-CM

## 2021-05-27 NOTE — Telephone Encounter (Signed)
Requested Prescriptions  Pending Prescriptions Disp Refills  . valACYclovir (VALTREX) 1000 MG tablet [Pharmacy Med Name: VALACYCLOVIR HCL 1 GRAM TABLET] 30 tablet 2    Sig: TAKE 1 TABLET BY MOUTH EVERY DAY     Antimicrobials:  Antiviral Agents - Anti-Herpetic Passed - 05/27/2021  2:14 PM      Passed - Valid encounter within last 12 months    Recent Outpatient Visits          4 days ago Essential hypertension   [facility] Reubin Milan, MD   6 months ago Annual physical exam   [facility] Reubin Milan, MD   1 year ago Annual physical exam   [facility] Reubin Milan, MD   2 years ago Annual physical exam   [facility] Reubin Milan, MD   3 years ago Annual physical exam   [facility] Reubin Milan, MD      Future Appointments            In 6 months Judithann Graves Nyoka Cowden, MD [facility], [facility]

## 2021-06-15 ENCOUNTER — Other Ambulatory Visit: Payer: Self-pay | Admitting: Internal Medicine

## 2021-06-15 DIAGNOSIS — I10 Essential (primary) hypertension: Secondary | ICD-10-CM

## 2021-07-20 ENCOUNTER — Encounter: Payer: Self-pay | Admitting: Gastroenterology

## 2021-07-20 ENCOUNTER — Ambulatory Visit (INDEPENDENT_AMBULATORY_CARE_PROVIDER_SITE_OTHER): Payer: 59 | Admitting: Gastroenterology

## 2021-07-20 ENCOUNTER — Other Ambulatory Visit: Payer: Self-pay

## 2021-07-20 VITALS — BP 165/114 | HR 111 | Temp 98.0°F | Ht 64.0 in | Wt 183.0 lb

## 2021-07-20 DIAGNOSIS — R748 Abnormal levels of other serum enzymes: Secondary | ICD-10-CM

## 2021-07-20 NOTE — Progress Notes (Signed)
Gastroenterology Consultation  Referring Provider:     Reubin Milan, MD Primary Care Physician:  Reubin Milan, MD Primary Gastroenterologist:  Dr. Servando Snare     Reason for Consultation:     Abnormal liver enzymes        HPI:   Lindsay Mack is a 40 y.o. y/o female referred for consultation & management of abnormal liver enzymes by Dr. Judithann Graves, Nyoka Cowden, MD. this patient comes to see me after being seen by his primary care provider and found to have abnormal liver enzymes.  The patient was seen by hematology for an increased ferritin over thousand and and they recommended that it may be acute phase reactant due to the patient's iron being normal.  The patient's liver enzymes have been elevated with the trend showing:  Component     Latest Ref Rng & Units 01/23/2017 11/08/2017 11/11/2018 11/18/2019  Total Bilirubin     0.0 - 1.2 mg/dL 0.4 1.2 0.4 0.5  Alkaline Phosphatase     44 - 121 IU/L 72 71 86 138 (H)  AST     0 - 40 IU/L 39 113 (H) 76 (H) 193 (H)  ALT     0 - 32 IU/L 48 (H) 100 (H) 109 (H) 193 (H)   Component     Latest Ref Rng & Units 11/23/2020 05/23/2021  Total Bilirubin     0.0 - 1.2 mg/dL 0.6 0.6  Alkaline Phosphatase     44 - 121 IU/L 112 133 (H)  AST     0 - 40 IU/L 101 (H) 245 (H)  ALT     0 - 32 IU/L 105 (H) 143 (H)   As can be seen the patient's liver enzymes have been elevated since 2018.  The patient's hepatitis C antibody has been negative as well as the patient's hepatitis B surface antigen.  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  She is unable to quantify how much she drinks but she states that she likes to have at least 1 drink with dinner and a few on the weekend.  She states that she drinks more when she has trouble sleeping which is quite frequently and when she is under a lot of stress.  She denies any Advil Aleve  Motrin BCs or Goody powder abuse although she does take it when she has her periods.  There is no report of any unexplained weight loss and she reports that she does not know her family history since she is adopted.  The patient denies having any imaging of her liver in the past.  She does not have any unexplained weight loss black stools or bloody stools.   Past Medical History:  Diagnosis Date   Hypertension     Past Surgical History:  Procedure Laterality Date   NO PAST SURGERIES      Prior to Admission medications   Medication Sig Start Date End Date Taking? Authorizing Provider  amLODipine (NORVASC) 5 MG tablet TAKE 1 TABLET BY MOUTH EVERY DAY 06/15/21   Reubin Milan, MD  hydrochlorothiazide (MICROZIDE) 12.5 MG capsule Take 1 capsule (12.5 mg total) by mouth daily. 05/23/21   Reubin Milan, MD  OMEPRAZOLE PO Take 5 mg by mouth daily.    [provider]  valACYclovir (VALTREX) 1000 MG tablet TAKE 1 TABLET BY MOUTH EVERY  DAY 05/27/21   Reubin Milan, MD    Family History  Adopted: Yes  Family history unknown: Yes     Social History   Tobacco Use   Smoking status: Every Day    Packs/day: 0.50    Years: 20.00    Pack years: 10.00    Types: Cigarettes   Smokeless tobacco: Never  Vaping Use   Vaping Use: Never used  Substance Use Topics   Alcohol use: No    Alcohol/week: 2.0 standard drinks    Types: 2 Standard drinks or equivalent per week   Drug use: No    Allergies as of 07/20/2021 - Review Complete 05/23/2021  Allergen Reaction Noted   Lactose Diarrhea 05/23/2021   Lac bovis Other (See Comments)     Review of Systems:    All systems reviewed and negative except where noted in HPI.   Physical Exam:  There were no vitals taken for this visit. No LMP recorded. General:   Alert,  Well-developed, well-nourished, pleasant and cooperative in NAD Head:  Normocephalic and atraumatic. Eyes:  Sclera clear, no icterus.   Conjunctiva pink. Ears:   Normal auditory acuity. Neck:  Supple; no masses or thyromegaly. Lungs:  Respirations even and unlabored.  Clear throughout to auscultation.   No wheezes, crackles, or rhonchi. No acute distress. Heart:  Regular rate and rhythm; no murmurs, clicks, rubs, or gallops. Abdomen:  Normal bowel sounds.  No bruits.  Soft, non-tender and non-distended without masses, hepatosplenomegaly or hernias noted.  No guarding or rebound tenderness.  egative Carnett sign.   Rectal:  Deferred.  Pulses:  Normal pulses noted. Extremities:  No clubbing or edema.  No cyanosis. Neurologic:  Alert and oriented x3;  grossly normal neurologically. Skin:  Intact without significant lesions or rashes.  No jaundice. Lymph Nodes:  No significant cervical adenopathy. Psych:  Alert and cooperative. Normal mood and affect.  Imaging Studies: No results found.  Assessment and Plan:   Lindsay Mack is a 40 y.o. y/o female who comes in today with a history of abnormal liver enzymes.  The patient does drink on a regular basis but is unable to quantify how much.  The patient will have a right upper quadrant ultrasound ordered and she will also have her blood work sent off for possible causes of her abnormal liver enzymes including autoimmune markers.  The patient will also be checked for immunity to hepatitis A and B and vaccinated accordingly.  She is also been told that if the labs do not delineate what may be causing her abnormal liver enzymes then she may need a liver biopsy.  The patient has been explained the plan and agrees with it.    Midge Minium, MD. Clementeen Graham    Note: This dictation was prepared with Dragon dictation along with smaller phrase technology. Any transcriptional errors that result from this process are unintentional.

## 2021-07-20 NOTE — Patient Instructions (Addendum)
[facility] RUQ [facility]. Nothing to eat or drink after midnight

## 2021-07-29 LAB — MITOCHONDRIAL ANTIBODIES: Mitochondrial Ab: <20 Units (ref 0.0–20.0)

## 2021-07-29 LAB — HEPATITIS B SURFACE ANTIBODY,QUALITATIVE: Hep B Surface Ab, Qual: REACTIVE

## 2021-07-29 LAB — ANA: ANA Titer 1: NEGATIVE

## 2021-07-29 LAB — CERULOPLASMIN: Ceruloplasmin: 33.4 mg/dL (ref 19.0–39.0)

## 2021-07-29 LAB — HEPATITIS A ANTIBODY, TOTAL: hep A Total Ab: POSITIVE — AB

## 2021-07-29 LAB — ALPHA-1-ANTITRYPSIN: A-1 Antitrypsin: 206 mg/dL — ABNORMAL HIGH (ref 100–188)

## 2021-07-29 LAB — ANTI-SMOOTH MUSCLE ANTIBODY, IGG: Smooth Muscle Ab: 4 Units (ref 0–19)

## 2021-07-31 ENCOUNTER — Ambulatory Visit: Payer: 59

## 2021-08-16 ENCOUNTER — Ambulatory Visit
Admission: RE | Admit: 2021-08-16 | Discharge: 2021-08-16 | Disposition: A | Discharge status: Home / Self Care | Payer: 59 | Source: Ambulatory Visit | Attending: Gastroenterology | Admitting: Gastroenterology

## 2021-08-16 ENCOUNTER — Other Ambulatory Visit: Payer: Self-pay

## 2021-08-16 DIAGNOSIS — R748 Abnormal levels of other serum enzymes: Secondary | ICD-10-CM | POA: Insufficient documentation

## 2021-08-21 ENCOUNTER — Telehealth: Payer: Self-pay

## 2021-08-21 NOTE — Telephone Encounter (Signed)
-----   Message from Midge Minium, MD sent at 08/21/2021  9:55 AM EDT ----- Please let the patient know that the ultrasound did show findings suggestive of fatty liver.  This can be a cause of her abnormal liver enzymes but everything else should be ruled out prior to blaming it on the fatty liver.

## 2021-08-21 NOTE — Telephone Encounter (Signed)
-----   Message from Midge Minium, MD sent at 08/21/2021  9:44 AM EDT ----- Please let the patient know that the blood work did not show any cause for the abnormal liver enzymes and it did show that she is immune to both hepatitis A and hepatitis B and does not need a vaccination.  Prior to proceeding with a liver biopsy at this point I would recheck the liver enzymes since the last liver enzymes was done in July.  If those are elevated then a liver biopsy may be needed.

## 2021-08-21 NOTE — Telephone Encounter (Signed)
LVM for pt to return my call regarding labs and US results.   

## 2021-09-04 ENCOUNTER — Other Ambulatory Visit: Payer: Self-pay | Admitting: Internal Medicine

## 2021-09-04 DIAGNOSIS — B009 Herpesviral infection, unspecified: Secondary | ICD-10-CM

## 2021-09-04 NOTE — Telephone Encounter (Signed)
Requested Prescriptions  Pending Prescriptions Disp Refills  . valACYclovir (VALTREX) 1000 MG tablet [Pharmacy Med Name: VALACYCLOVIR HCL 1 GRAM TABLET] 30 tablet 2    Sig: TAKE 1 TABLET BY MOUTH EVERY DAY     Antimicrobials:  Antiviral Agents - Anti-Herpetic Passed - 09/04/2021  1:35 AM      Passed - Valid encounter within last 12 months    Recent Outpatient Visits          3 months ago Essential hypertension   [facility] Reubin Milan, MD   9 months ago Annual physical exam   [facility] Reubin Milan, MD   1 year ago Annual physical exam   [facility] Reubin Milan, MD   2 years ago Annual physical exam   [facility] Reubin Milan, MD   3 years ago Annual physical exam   [facility] Reubin Milan, MD      Future Appointments            In 2 months Judithann Graves Nyoka Cowden, MD [facility], [facility]

## 2021-09-22 ENCOUNTER — Other Ambulatory Visit: Payer: Self-pay

## 2021-09-22 DIAGNOSIS — R748 Abnormal levels of other serum enzymes: Secondary | ICD-10-CM

## 2021-11-29 ENCOUNTER — Encounter: Payer: 59 | Admitting: Internal Medicine

## 2021-12-17 ENCOUNTER — Other Ambulatory Visit: Payer: Self-pay | Admitting: Internal Medicine

## 2021-12-17 DIAGNOSIS — B009 Herpesviral infection, unspecified: Secondary | ICD-10-CM

## 2021-12-17 DIAGNOSIS — I10 Essential (primary) hypertension: Secondary | ICD-10-CM

## 2021-12-18 NOTE — Telephone Encounter (Signed)
Requested Prescriptions  Pending Prescriptions Disp Refills   hydrochlorothiazide (MICROZIDE) 12.5 MG capsule [Pharmacy Med Name: HYDROCHLOROTHIAZIDE 12.5 MG CP] 30 capsule 5    Sig: TAKE 1 CAPSULE BY MOUTH EVERY DAY     Cardiovascular: Diuretics - Thiazide Failed - 12/17/2021  9:33 AM      Failed - Cr in normal range and within 180 days    Creatinine, Ser  Date Value Ref Range Status  05/23/2021 0.65 0.57 - 1.00 mg/dL Final         Failed - K in normal range and within 180 days    Potassium  Date Value Ref Range Status  05/23/2021 4.1 3.5 - 5.2 mmol/L Final         Failed - Na in normal range and within 180 days    Sodium  Date Value Ref Range Status  05/23/2021 141 134 - 144 mmol/L Final         Failed - Last BP in normal range    BP Readings from Last 1 Encounters:  07/20/21 (!) 165/114         Failed - Valid encounter within last 6 months    Recent Outpatient Visits          6 months ago Essential hypertension   [facility] Reubin Milan, MD   1 year ago Annual physical exam   [facility] Reubin Milan, MD   2 years ago Annual physical exam   [facility] Reubin Milan, MD   3 years ago Annual physical exam   [facility] Reubin Milan, MD   4 years ago Annual physical exam   [facility] Reubin Milan, MD              valACYclovir (VALTREX) 1000 MG tablet [Pharmacy Med Name: VALACYCLOVIR HCL 1 GRAM TABLET] 30 tablet 0    Sig: TAKE 1 TABLET BY MOUTH EVERY DAY     Antimicrobials:  Antiviral Agents - Anti-Herpetic Passed - 12/17/2021  9:33 AM      Passed - Valid encounter within last 12 months    Recent Outpatient Visits          6 months ago Essential hypertension   [facility] Reubin Milan, MD   1 year ago Annual physical exam   [facility] Reubin Milan, MD   2 years ago Annual physical exam   [facility] Reubin Milan, MD   3 years ago Annual  physical exam   [facility] Reubin Milan, MD   4 years ago Annual physical exam   [facility] Reubin Milan, MD

## 2022-01-13 ENCOUNTER — Other Ambulatory Visit: Payer: Self-pay | Admitting: Internal Medicine

## 2022-01-13 DIAGNOSIS — B009 Herpesviral infection, unspecified: Secondary | ICD-10-CM

## 2022-01-15 NOTE — Telephone Encounter (Signed)
Called pt and LMOM to c/b and make appt. ?

## 2022-01-15 NOTE — Telephone Encounter (Signed)
Requested medications are due for refill today.  yes ? ?Requested medications are on the active medications list.  yes ? ?Last refill. 12/18/2021 #30 0 refills ? ?Future visit scheduled.   no ? ?Notes to clinic.  Called pt and LMOM and left mychart message asking pt to call to make an appt. ? ? ? ?Requested Prescriptions  ?Pending Prescriptions Disp Refills  ? valACYclovir (VALTREX) 1000 MG tablet [Pharmacy Med Name: VALACYCLOVIR HCL 1 GRAM TABLET] 30 tablet 0  ?  Sig: TAKE 1 TABLET BY MOUTH EVERY DAY  ?  ? Antimicrobials:  Antiviral Agents - Anti-Herpetic Passed - 01/13/2022 12:34 PM  ?  ?  Passed - Valid encounter within last 12 months  ?  Recent Outpatient Visits   ? ?      ? 7 months ago Essential hypertension  ? [facility] Glean Hess, MD  ? 1 year ago Annual physical exam  ? [facility] Glean Hess, MD  ? 2 years ago Annual physical exam  ? [facility] Glean Hess, MD  ? 3 years ago Annual physical exam  ? [facility] Glean Hess, MD  ? 4 years ago Annual physical exam  ? [facility] Glean Hess, MD  ? ?  ?  ? ?  ?  ?  ?  ?

## 2022-01-18 ENCOUNTER — Ambulatory Visit: Payer: 59 | Admitting: Internal Medicine

## 2022-01-18 NOTE — Progress Notes (Deleted)
? ? ?Date:  01/18/2022  ? ?Name:  Lindsay Mack   DOB:  [birth year]   MRN:  [id number] ? ? ?Chief Complaint: No chief complaint on file. ? ?Hypertension ?This is a chronic problem. The problem is controlled. Past treatments include diuretics and calcium channel blockers. The current treatment provides significant improvement. There are no compliance problems.   ? ?Lab Results  ?Component Value Date  ? NA 141 05/23/2021  ? K 4.1 05/23/2021  ? CO2 21 05/23/2021  ? GLUCOSE 92 05/23/2021  ? BUN 7 05/23/2021  ? CREATININE 0.65 05/23/2021  ? CALCIUM 9.5 05/23/2021  ? EGFR 115 05/23/2021  ? GFRNONAA 115 11/23/2020  ? ?Lab Results  ?Component Value Date  ? CHOL 267 (H) 11/23/2020  ? HDL 59 11/23/2020  ? LDLCALC 167 (H) 11/23/2020  ? TRIG 224 (H) 11/23/2020  ? CHOLHDL 4.5 (H) 11/23/2020  ? ?Lab Results  ?Component Value Date  ? TSH 1.920 11/23/2020  ? ?No results found for: HGBA1C ?Lab Results  ?Component Value Date  ? WBC 8.0 05/23/2021  ? HGB 15.1 05/23/2021  ? HCT 41.5 05/23/2021  ? MCV 103 (H) 05/23/2021  ? PLT 270 05/23/2021  ? ?Lab Results  ?Component Value Date  ? ALT 143 (H) 05/23/2021  ? AST 245 (H) 05/23/2021  ? ALKPHOS 133 (H) 05/23/2021  ? BILITOT 0.6 05/23/2021  ? ?No results found for: 25OHVITD2, [place], VD25OH  ? ?Review of Systems ? ?Patient Active Problem List  ? Diagnosis Date Noted  ? 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 Elevated TSH 12/14/2019  ? Elevated ferritin 12/11/2019  ? Macrocytosis 12/11/2019  ? Mild hyperlipidemia 11/11/2018  ? Tobacco use disorder 11/11/2018  ? Elevated liver function tests 01/23/2017  ? Scoliosis of lumbar spine 01/23/2017  ? Awareness of heartbeats 12/14/2016  ? Essential hypertension 12/14/2016  ? Herpes simplex type 2 infection 12/14/2016  ? ? ?Allergies  ?Allergen Reactions  ? Lactose Diarrhea  ? Lac Bovis Other (See Comments)  ?  Other Reaction: GI UPSET  ? ? ?Past Surgical History:  ?Procedure Laterality Date  ? NO PAST SURGERIES    ? ? ?Social  History  ? ?Tobacco Use  ? Smoking status: Every Day  ?  Packs/day: 0.50  ?  Years: 20.00  ?  Pack years: 10.00  ?  Types: Cigarettes  ? Smokeless tobacco: Never  ?Vaping Use  ? Vaping Use: Never used  ?Substance Use Topics  ? Alcohol use: Yes  ?  Alcohol/week: 14.0 standard drinks  ?  Types: 14 Standard drinks or equivalent per week  ?  Comment: states drinks every night 1-2 glasses of wine or beer  ? Drug use: No  ? ? ? ?Medication list has been reviewed and updated. ? ?No outpatient medications have been marked as taking for the 01/18/22 encounter (Appointment) with Glean Hess, MD.  ? ? ?PHQ 2/9 Scores 05/23/2021 11/23/2020 11/18/2019 11/11/2018  ?PHQ - 2 Score 0 0 0 0  ?PHQ- 9 Score 0 0 0 -  ? ? ?GAD 7 : Generalized Anxiety Score 05/23/2021 11/23/2020  ?Nervous, Anxious, on Edge 0 0  ?Control/stop worrying 0 0  ?Worry too much - different things 0 0  ?Trouble relaxing 0 0  ?Restless 0 0  ?Easily annoyed or irritable 0 0  ?Afraid - awful might happen 0 0  ?Total GAD 7 Score 0 0  ?Anxiety Difficulty Not difficult at all -  ? ? ?BP Readings from Last 3  Encounters:  ?07/20/21 (!) 165/114  ?05/23/21 136/88  ?01/20/21 (!) 153/105  ? ? ?Physical Exam ? ?Wt Readings from Last 3 Encounters:  ?07/20/21 183 lb (83 kg)  ?05/23/21 180 lb (81.6 kg)  ?11/23/20 179 lb (81.2 kg)  ? ? ?There were no vitals taken for this visit. ? ?Assessment and Plan: ? ? ? ? ?

## 2022-01-23 ENCOUNTER — Other Ambulatory Visit: Payer: Self-pay | Admitting: Internal Medicine

## 2022-01-23 DIAGNOSIS — I10 Essential (primary) hypertension: Secondary | ICD-10-CM

## 2022-01-24 NOTE — Telephone Encounter (Signed)
Courtesy refill. ?Requested Prescriptions  ?Pending Prescriptions Disp Refills  ?? amLODipine (NORVASC) 5 MG tablet [Pharmacy Med Name: AMLODIPINE BESYLATE 5 MG TAB] 30 tablet 0  ?  Sig: TAKE 1 TABLET BY MOUTH EVERY DAY  ?  ? Cardiovascular: Calcium Channel Blockers 2 Failed - 01/23/2022  4:59 PM  ?  ?  Failed - Last BP in normal range  ?  BP Readings from Last 1 Encounters:  ?07/20/21 (!) 165/114  ?   ?  ?  Failed - Last Heart Rate in normal range  ?  Pulse Readings from Last 1 Encounters:  ?07/20/21 (!) 111  ?   ?  ?  Failed - Valid encounter within last 6 months  ?  Recent Outpatient Visits   ?      ? 8 months ago Essential hypertension  ? [facility] Reubin Milan, MD  ? 1 year ago Annual physical exam  ? [facility] Reubin Milan, MD  ? 2 years ago Annual physical exam  ? [facility] Reubin Milan, MD  ? 3 years ago Annual physical exam  ? [facility] Reubin Milan, MD  ? 4 years ago Annual physical exam  ? [facility] Reubin Milan, MD  ?  ?  ?Future Appointments   ?        ? In 1 week Reubin Milan, MD [facility]  ?  ? ?  ?  ?  ? ?

## 2022-01-29 ENCOUNTER — Other Ambulatory Visit: Payer: Self-pay | Admitting: Internal Medicine

## 2022-01-29 DIAGNOSIS — I10 Essential (primary) hypertension: Secondary | ICD-10-CM

## 2022-01-31 ENCOUNTER — Ambulatory Visit: Payer: 59 | Admitting: Internal Medicine

## 2022-01-31 ENCOUNTER — Other Ambulatory Visit: Payer: Self-pay

## 2022-01-31 ENCOUNTER — Encounter: Payer: Self-pay | Admitting: Internal Medicine

## 2022-01-31 VITALS — BP 158/110 | HR 78 | Ht 64.0 in | Wt 176.0 lb

## 2022-01-31 DIAGNOSIS — I10 Essential (primary) hypertension: Secondary | ICD-10-CM | POA: Diagnosis not present

## 2022-01-31 DIAGNOSIS — R7989 Other specified abnormal findings of blood chemistry: Secondary | ICD-10-CM

## 2022-01-31 DIAGNOSIS — B009 Herpesviral infection, unspecified: Secondary | ICD-10-CM | POA: Diagnosis not present

## 2022-01-31 MED ORDER — AMLODIPINE BESYLATE 5 MG PO TABS: 5.0000 mg | ORAL_TABLET | Freq: Every day | ORAL | 1 refills | Status: DC

## 2022-01-31 MED ORDER — VALACYCLOVIR HCL 1 G PO TABS: 1000.0000 mg | ORAL_TABLET | Freq: Every day | ORAL | 0 refills | Status: DC

## 2022-01-31 MED ORDER — HYDROCHLOROTHIAZIDE 12.5 MG PO CAPS: 12.5000 mg | ORAL_CAPSULE | Freq: Every day | ORAL | 1 refills | Status: DC

## 2022-01-31 NOTE — Telephone Encounter (Signed)
Requested medications are due for refill today.  yes ? ?Requested medications are on the active medications list.  yes ? ?Last refill. 05/23/2021 #90 1 refill ? ?Future visit scheduled.   today ? ?Notes to clinic.  Labs are expired. Pt is at appointment now. ? ? ? ?Requested Prescriptions  ?Pending Prescriptions Disp Refills  ? hydrochlorothiazide (MICROZIDE) 12.5 MG capsule [Pharmacy Med Name: HYDROCHLOROTHIAZIDE 12.5 MG CP] 30 capsule 5  ?  Sig: TAKE 1 CAPSULE BY MOUTH EVERY DAY  ?  ? Cardiovascular: Diuretics - Thiazide Failed - 01/29/2022 12:47 PM  ?  ?  Failed - Cr in normal range and within 180 days  ?  Creatinine, Ser  ?Date Value Ref Range Status  ?05/23/2021 0.65 0.57 - 1.00 mg/dL Final  ?  ?  ?  ?  Failed - K in normal range and within 180 days  ?  Potassium  ?Date Value Ref Range Status  ?05/23/2021 4.1 3.5 - 5.2 mmol/L Final  ?  ?  ?  ?  Failed - Na in normal range and within 180 days  ?  Sodium  ?Date Value Ref Range Status  ?05/23/2021 141 134 - 144 mmol/L Final  ?  ?  ?  ?  Failed - Last BP in normal range  ?  BP Readings from Last 1 Encounters:  ?01/31/22 (!) 158/110  ?  ?  ?  ?  Failed - Valid encounter within last 6 months  ?  Recent Outpatient Visits   ? ?      ? Today Herpes simplex type 2 infection  ? [facility] Reubin Milan, MD  ? 8 months ago Essential hypertension  ? [facility] Reubin Milan, MD  ? 1 year ago Annual physical exam  ? [facility] Reubin Milan, MD  ? 2 years ago Annual physical exam  ? [facility] Reubin Milan, MD  ? 3 years ago Annual physical exam  ? [facility] Reubin Milan, MD  ? ?  ?  ? ?  ?  ?  ?  ?

## 2022-01-31 NOTE — Progress Notes (Signed)
? ? ?Date:  01/31/2022  ? ?Name:  Lindsay Mack   DOB:  [birth year]   MRN:  [id number] ? ? ?Chief Complaint: Hypertension ? ?Hypertension ?This is a chronic problem. Pertinent negatives include no blurred vision, chest pain, headaches, palpitations, peripheral edema or shortness of breath. Past treatments include calcium channel blockers and diuretics (stopped hctz several months ago when RF ran out and was not sure if she should continue.  Called for refill on Norvasc last week after running out and never got a message that it was refilled.). There is no history of kidney disease, CAD/MI or CVA.  ?Elevated LFTs - seen by GI last fall.  Metabolic work up was negative other than hemochromatosis carrier.  They had recommended repeat liver testing prior to a liver biopsy but she never had the labs done. ?Lab Results  ?Component Value Date  ? NA 141 05/23/2021  ? K 4.1 05/23/2021  ? CO2 21 05/23/2021  ? GLUCOSE 92 05/23/2021  ? BUN 7 05/23/2021  ? CREATININE 0.65 05/23/2021  ? CALCIUM 9.5 05/23/2021  ? EGFR 115 05/23/2021  ? GFRNONAA 115 11/23/2020  ? ?Lab Results  ?Component Value Date  ? CHOL 267 (H) 11/23/2020  ? HDL 59 11/23/2020  ? LDLCALC 167 (H) 11/23/2020  ? TRIG 224 (H) 11/23/2020  ? CHOLHDL 4.5 (H) 11/23/2020  ? ?Lab Results  ?Component Value Date  ? TSH 1.920 11/23/2020  ? ?No results found for: HGBA1C ?Lab Results  ?Component Value Date  ? WBC 8.0 05/23/2021  ? HGB 15.1 05/23/2021  ? HCT 41.5 05/23/2021  ? MCV 103 (H) 05/23/2021  ? PLT 270 05/23/2021  ? ?Lab Results  ?Component Value Date  ? ALT 143 (H) 05/23/2021  ? AST 245 (H) 05/23/2021  ? ALKPHOS 133 (H) 05/23/2021  ? BILITOT 0.6 05/23/2021  ? ?No results found for: 25OHVITD2, [place], VD25OH  ? ?Review of Systems  ?Constitutional:  Negative for chills, fatigue and fever.  ?Eyes:  Negative for blurred vision.  ?Respiratory:  Negative for chest tightness and shortness of breath.   ?Cardiovascular:  Negative for chest pain and palpitations.  ?Neurological:   Negative for headaches.  ? ?Patient Active Problem List  ? Diagnosis Date Noted  ? 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 Elevated TSH 12/14/2019  ? Elevated ferritin 12/11/2019  ? Macrocytosis 12/11/2019  ? Mild hyperlipidemia 11/11/2018  ? Tobacco use disorder 11/11/2018  ? Elevated liver function tests 01/23/2017  ? Scoliosis of lumbar spine 01/23/2017  ? Awareness of heartbeats 12/14/2016  ? Essential hypertension 12/14/2016  ? Herpes simplex type 2 infection 12/14/2016  ? ? ?Allergies  ?Allergen Reactions  ? Lactose Diarrhea  ? Lac Bovis Other (See Comments)  ?  Other Reaction: GI UPSET  ? ? ?Past Surgical History:  ?Procedure Laterality Date  ? NO PAST SURGERIES    ? ? ?Social History  ? ?Tobacco Use  ? Smoking status: Every Day  ?  Packs/day: 0.50  ?  Years: 20.00  ?  Pack years: 10.00  ?  Types: Cigarettes  ? Smokeless tobacco: Never  ?Vaping Use  ? Vaping Use: Never used  ?Substance Use Topics  ? Alcohol use: Yes  ?  Alcohol/week: 14.0 standard drinks  ?  Types: 14 Standard drinks or equivalent per week  ?  Comment: states drinks every night 1-2 glasses of wine or beer  ? Drug use: No  ? ? ? ?Medication list has been reviewed and updated. ? ?Current  Meds  ?Medication Sig  ? amLODipine (NORVASC) 5 MG tablet TAKE 1 TABLET BY MOUTH EVERY DAY  ? OMEPRAZOLE PO Take 5 mg by mouth daily.  ? valACYclovir (VALTREX) 1000 MG tablet TAKE 1 TABLET BY MOUTH EVERY DAY  ? ? ? ?  01/31/2022  ?  9:48 AM 05/23/2021  ?  8:27 AM 11/23/2020  ?  8:51 AM 11/18/2019  ?  8:29 AM  ?PHQ 2/9 Scores  ?PHQ - 2 Score 0 0 0 0  ?PHQ- 9 Score 0 0 0 0  ? ? ? ?  01/31/2022  ?  9:48 AM 05/23/2021  ?  8:27 AM 11/23/2020  ?  8:52 AM  ?GAD 7 : Generalized Anxiety Score  ?Nervous, Anxious, on Edge 0 0 0  ?Control/stop worrying 0 0 0  ?Worry too much - different things 0 0 0  ?Trouble relaxing 0 0 0  ?Restless 0 0 0  ?Easily annoyed or irritable 0 0 0  ?Afraid - awful might happen 0 0 0  ?Total GAD 7 Score 0 0 0  ?Anxiety Difficulty  Not  difficult at all   ? ? ?BP Readings from Last 3 Encounters:  ?01/31/22 (!) 158/110  ?07/20/21 (!) 165/114  ?05/23/21 136/88  ? ? ?Physical Exam ?Vitals and nursing note reviewed.  ?Constitutional:   ?   General: She is not in acute distress. ?   Appearance: Normal appearance. She is well-developed.  ?HENT:  ?   Head: Normocephalic and atraumatic.  ?Neck:  ?   Vascular: No carotid bruit.  ?Cardiovascular:  ?   Rate and Rhythm: Normal rate and regular rhythm.  ?   Pulses: Normal pulses.  ?Pulmonary:  ?   Effort: Pulmonary effort is normal. No respiratory distress.  ?   Breath sounds: No wheezing or rhonchi.  ?Musculoskeletal:  ?   Cervical back: Normal range of motion.  ?   Right lower leg: No edema.  ?   Left lower leg: No edema.  ?Lymphadenopathy:  ?   Cervical: No cervical adenopathy.  ?Skin: ?   General: Skin is warm and dry.  ?   Findings: No rash.  ?Neurological:  ?   Mental Status: She is alert and oriented to person, place, and time.  ?Psychiatric:     ?   Mood and Affect: Mood normal.     ?   Behavior: Behavior normal.  ? ? ?Wt Readings from Last 3 Encounters:  ?01/31/22 176 lb (79.8 kg)  ?07/20/21 183 lb (83 kg)  ?05/23/21 180 lb (81.6 kg)  ? ? ?BP (!) 158/110   Pulse 78   Ht 5' 4"  (1.626 m)   Wt 176 lb (79.8 kg)   SpO2 98%   BMI 30.21 kg/m?  ? ?Assessment and Plan: ?1. Herpes simplex type 2 infection ?Continue to use Valtrex PRN ?- valACYclovir (VALTREX) 1000 MG tablet; Take 1 tablet (1,000 mg total) by mouth daily.  Dispense: 90 tablet; Refill: 0 ? ?2. Essential hypertension ?Not controlled due to compliance issues. ?Resume both medications and follow up in 2 months. ?- hydrochlorothiazide (MICROZIDE) 12.5 MG capsule; Take 1 capsule (12.5 mg total) by mouth daily.  Dispense: 90 capsule; Refill: 1 ?- amLODipine (NORVASC) 5 MG tablet; Take 1 tablet (5 mg total) by mouth daily.  Dispense: 90 tablet; Refill: 1 ? ?3. Elevated liver function tests ?Will repeat labs today as requested by Dr. Allen Norris and advise  on follow up. ?- Comprehensive metabolic panel ? ? ?Partially dictated using  Editor, commissioning. Any errors are unintentional. ? ?Halina Maidens, MD ?[facility] ?[facility] ? ?01/31/2022 ? ? ? ? ? ?

## 2022-02-01 LAB — COMPREHENSIVE METABOLIC PANEL
ALT: 93 IU/L — ABNORMAL HIGH (ref 0–32)
AST: 155 IU/L — ABNORMAL HIGH (ref 0–40)
Albumin/Globulin Ratio: 1.6 (ref 1.2–2.2)
Albumin: 4.1 g/dL (ref 3.8–4.8)
Alkaline Phosphatase: 124 IU/L — ABNORMAL HIGH (ref 44–121)
BUN/Creatinine Ratio: 11 (ref 9–23)
BUN: 7 mg/dL (ref 6–24)
Bilirubin Total: 0.5 mg/dL (ref 0.0–1.2)
CO2: 22 mmol/L (ref 20–29)
Calcium: 9.3 mg/dL (ref 8.7–10.2)
Chloride: 104 mmol/L (ref 96–106)
Creatinine, Ser: 0.62 mg/dL (ref 0.57–1.00)
Globulin, Total: 2.5 g/dL (ref 1.5–4.5)
Glucose: 97 mg/dL (ref 70–99)
Potassium: 4.4 mmol/L (ref 3.5–5.2)
Sodium: 142 mmol/L (ref 134–144)
Total Protein: 6.6 g/dL (ref 6.0–8.5)
eGFR: 115 mL/min/{1.73_m2} (ref >59)

## 2022-03-21 ENCOUNTER — Ambulatory Visit: Payer: 59 | Admitting: Gastroenterology

## 2022-03-21 NOTE — Progress Notes (Deleted)
Primary Care Physician: Reubin Milan, MD  Primary Gastroenterologist:  Dr. Midge Minium  No chief complaint on file.   HPI: Lindsay Mack is a 41 y.o. female here for follow-up of abnormal liver enzymes.  The patient was seen by me at the end of last year and was recommended to have a repeat liver panel with recommendations that if the liver enzymes did not improve or return to normal that she may need a liver biopsy.  The patient had not followed up with those recommendations and then was seen by her primary care provider who then checked her liver enzymes with the trend of her liver enzymes showing:  Component     Latest Ref Rng 11/18/2019 11/23/2020 05/23/2021 01/31/2022  Total Bilirubin     0.0 - 1.2 mg/dL 0.5  0.6  0.6  0.5   Alkaline Phosphatase     44 - 121 IU/L 138 (H)  112  133 (H)  124 (H)   AST     0 - 40 IU/L 193 (H)  101 (H)  245 (H)  155 (H)   ALT     0 - 32 IU/L 193 (H)  105 (H)  143 (H)  93 (H)    The patient was found to be a carrier of the hemochromatosis gene.  The patient was found to be immune to hepatitis A and hepatitis B and had a increased ferritin of thousand.  Past Medical History:  Diagnosis Date   Hypertension     Current Outpatient Medications  Medication Sig Dispense Refill   amLODipine (NORVASC) 5 MG tablet Take 1 tablet (5 mg total) by mouth daily. 90 tablet 1   hydrochlorothiazide (MICROZIDE) 12.5 MG capsule Take 1 capsule (12.5 mg total) by mouth daily. 90 capsule 1   OMEPRAZOLE PO Take 5 mg by mouth daily.     valACYclovir (VALTREX) 1000 MG tablet Take 1 tablet (1,000 mg total) by mouth daily. 90 tablet 0   No current facility-administered medications for this visit.    Allergies as of 03/21/2022 - Review Complete 01/31/2022  Allergen Reaction Noted   Lactose Diarrhea 05/23/2021   Lac bovis Other (See Comments)     ROS:  General: Negative for anorexia, weight loss, fever, chills, fatigue, weakness. ENT: Negative for hoarseness,  difficulty swallowing , nasal congestion. CV: Negative for chest pain, angina, palpitations, dyspnea on exertion, peripheral edema.  Respiratory: Negative for dyspnea at rest, dyspnea on exertion, cough, sputum, wheezing.  GI: See history of present illness. GU:  Negative for dysuria, hematuria, urinary incontinence, urinary frequency, nocturnal urination.  Endo: Negative for unusual weight change.    Physical Examination:   There were no vitals taken for this visit.  General: Well-nourished, well-developed in no acute distress.  Eyes: No icterus. Conjunctivae pink. Lungs: Clear to auscultation bilaterally. Non-labored. Heart: Regular rate and rhythm, no murmurs rubs or gallops.  Abdomen: Bowel sounds are normal, nontender, nondistended, no hepatosplenomegaly or masses, no abdominal bruits or hernia , no rebound or guarding.   Extremities: No lower extremity edema. No clubbing or deformities. Neuro: Alert and oriented x 3.  Grossly intact. Skin: Warm and dry, no jaundice.   Psych: Alert and cooperative, normal mood and affect.  Labs:    Imaging Studies: No results found.  Assessment and Plan:   Lindsay Mack is a 41 y.o. y/o female ***     Midge Minium, MD. Clementeen Graham    Note: This dictation was prepared with Dragon dictation along  with smaller phrase technology. Any transcriptional errors that result from this process are unintentional.

## 2022-04-05 ENCOUNTER — Ambulatory Visit: Payer: 59 | Admitting: Internal Medicine

## 2022-04-05 NOTE — Progress Notes (Deleted)
Date:  04/05/2022   Name:  Lindsay Mack   DOB:  1981-03-30   MRN:  [id number]   Chief Complaint: No chief complaint on file.  Hypertension This is a chronic problem. The problem is controlled. Pertinent negatives include no chest pain, headaches, palpitations or shortness of breath. Past treatments include calcium channel blockers and diuretics. The current treatment provides significant improvement.  Had been out of medications for some time in March, medications restarted and she is here for follow up.  Elevated LFTs - did not follow up with Dr. Allen Norris as requested earlier this month.  Lab Results  Component Value Date   NA 142 01/31/2022   K 4.4 01/31/2022   CO2 22 01/31/2022   GLUCOSE 97 01/31/2022   BUN 7 01/31/2022   CREATININE 0.62 01/31/2022   CALCIUM 9.3 01/31/2022   EGFR 115 01/31/2022   GFRNONAA 115 11/23/2020   Lab Results  Component Value Date   CHOL 267 (H) 11/23/2020   HDL 59 11/23/2020   LDLCALC 167 (H) 11/23/2020   TRIG 224 (H) 11/23/2020   CHOLHDL 4.5 (H) 11/23/2020   Lab Results  Component Value Date   TSH 1.920 11/23/2020   No results found for: HGBA1C Lab Results  Component Value Date   WBC 8.0 05/23/2021   HGB 15.1 05/23/2021   HCT 41.5 05/23/2021   MCV 103 (H) 05/23/2021   PLT 270 05/23/2021   Lab Results  Component Value Date   ALT 93 (H) 01/31/2022   AST 155 (H) 01/31/2022   ALKPHOS 124 (H) 01/31/2022   BILITOT 0.5 01/31/2022   No results found for: 25OHVITD2, 25OHVITD3, VD25OH   Review of Systems  Constitutional:  Negative for fatigue and unexpected weight change.  HENT:  Negative for nosebleeds.   Eyes:  Negative for visual disturbance.  Respiratory:  Negative for cough, chest tightness, shortness of breath and wheezing.   Cardiovascular:  Negative for chest pain, palpitations and leg swelling.  Gastrointestinal:  Negative for abdominal pain, constipation and diarrhea.  Neurological:  Negative for dizziness, weakness,  light-headedness and headaches.   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   Elevated TSH 12/14/2019   Elevated ferritin 12/11/2019   Macrocytosis 12/11/2019   Mild hyperlipidemia 11/11/2018   Tobacco use disorder 11/11/2018   Elevated liver function tests 01/23/2017   Scoliosis of lumbar spine 01/23/2017   Awareness of heartbeats 12/14/2016   Essential hypertension 12/14/2016   Herpes simplex type 2 infection 12/14/2016    Allergies  Allergen Reactions   Lactose Diarrhea   Lac Bovis Other (See Comments)    Other Reaction: GI UPSET    Past Surgical History:  Procedure Laterality Date   NO PAST SURGERIES      Social History   Tobacco Use   Smoking status: Every Day    Packs/day: 0.50    Years: 20.00    Pack years: 10.00    Types: Cigarettes   Smokeless tobacco: Never  Vaping Use   Vaping Use: Never used  Substance Use Topics   Alcohol use: Yes    Alcohol/week: 14.0 standard drinks    Types: 14 Standard drinks or equivalent per week    Comment: states drinks every night 1-2 glasses of wine or beer   Drug use: No     Medication list has been reviewed and updated.  No outpatient medications have been marked as taking for the 04/05/22 encounter (Appointment)  with Glean Hess, MD.       01/31/2022    9:48 AM 05/23/2021    8:27 AM 11/23/2020    8:52 AM  GAD 7 : Generalized Anxiety Score  Nervous, Anxious, on Edge 0 0 0  Control/stop worrying 0 0 0  Worry too much - different things 0 0 0  Trouble relaxing 0 0 0  Restless 0 0 0  Easily annoyed or irritable 0 0 0  Afraid - awful might happen 0 0 0  Total GAD 7 Score 0 0 0  Anxiety Difficulty  Not difficult at all        01/31/2022    9:48 AM  Depression screen PHQ 2/9  Decreased Interest 0  Down, Depressed, Hopeless 0  PHQ - 2 Score 0  Altered sleeping 0  Tired, decreased energy 0  Change in appetite 0  Feeling bad or failure  about yourself  0  Trouble concentrating 0  Moving slowly or fidgety/restless 0  Suicidal thoughts 0  PHQ-9 Score 0  Difficult doing work/chores Not difficult at all    BP Readings from Last 3 Encounters:  01/31/22 (!) 158/110  07/20/21 (!) 165/114  05/23/21 136/88    Physical Exam Vitals and nursing note reviewed.  Constitutional:      General: She is not in acute distress.    Appearance: She is well-developed.  HENT:     Head: Normocephalic and atraumatic.  Cardiovascular:     Rate and Rhythm: Normal rate and regular rhythm.     Pulses: Normal pulses.  Pulmonary:     Effort: Pulmonary effort is normal. No respiratory distress.     Breath sounds: No wheezing or rhonchi.  Musculoskeletal:     Cervical back: Normal range of motion.  Lymphadenopathy:     Cervical: No cervical adenopathy.  Skin:    General: Skin is warm and dry.     Findings: No rash.  Neurological:     Mental Status: She is alert and oriented to person, place, and time.  Psychiatric:        Mood and Affect: Mood normal.        Behavior: Behavior normal.    Wt Readings from Last 3 Encounters:  01/31/22 176 lb (79.8 kg)  07/20/21 183 lb (83 kg)  05/23/21 180 lb (81.6 kg)    There were no vitals taken for this visit.  Assessment and Plan:

## 2022-04-15 IMAGING — US US ABDOMEN LIMITED
1 series · 15 of 25 positions shown · non-contrast
Comparison: None.

CLINICAL DATA: Elevated LFTs.

EXAM:
ULTRASOUND ABDOMEN LIMITED RIGHT UPPER QUADRANT

[Series 1: us abdomen limited ruq · 15 of 33 slices shown]
[im 1/33]
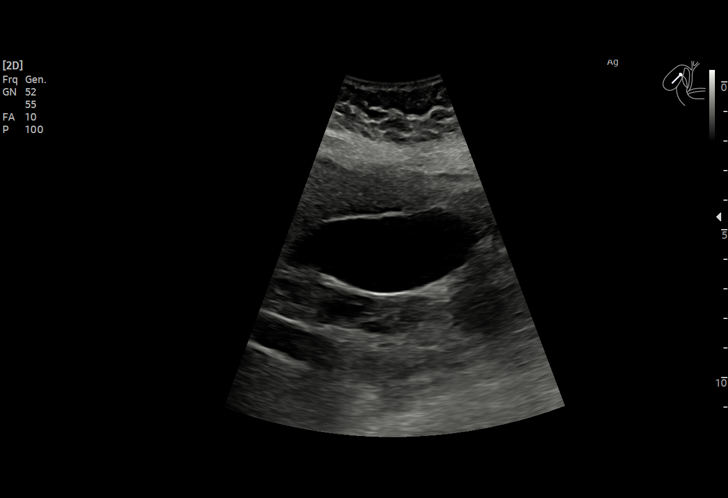
[im 3/33]
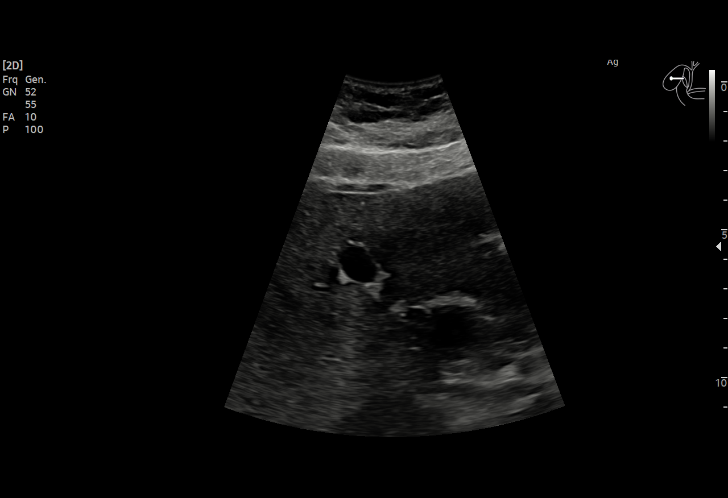
[im 6/33]
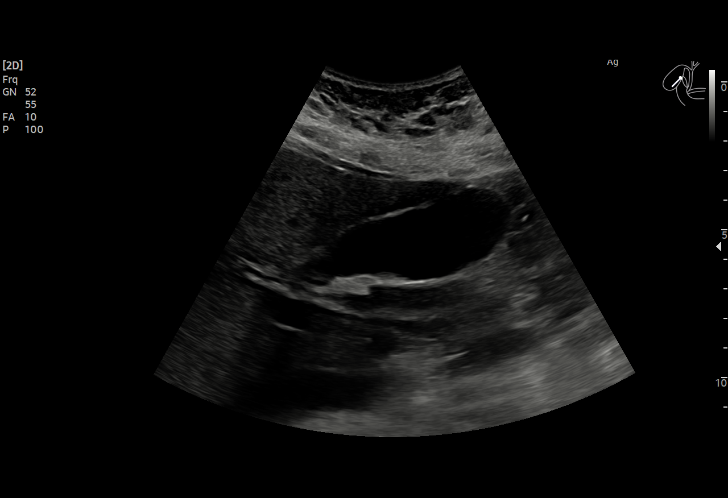
[im 7/33]
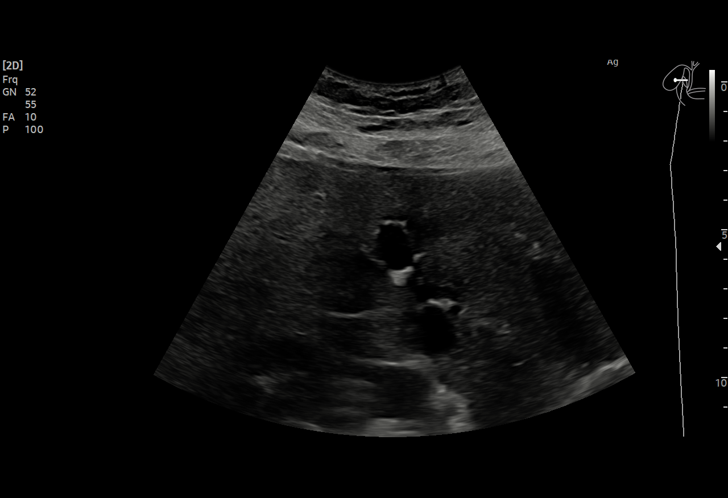
[im 10/33]
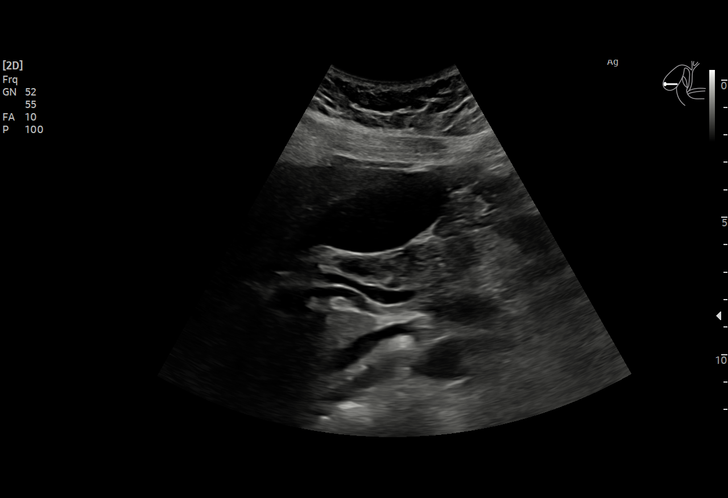
[im 13/33]
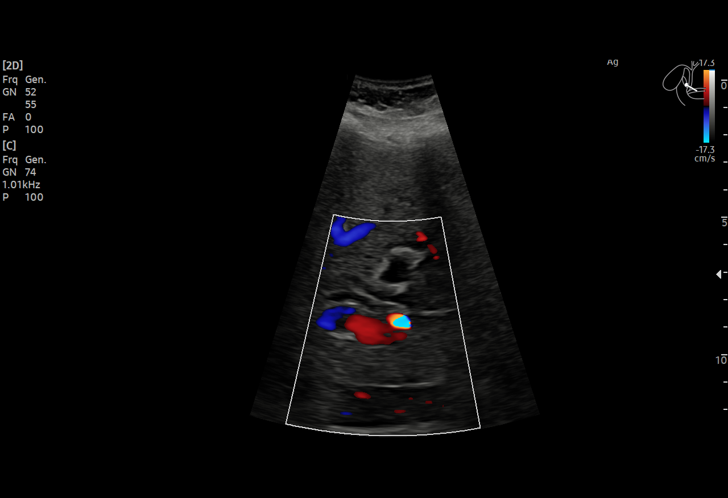
[im 14/33]
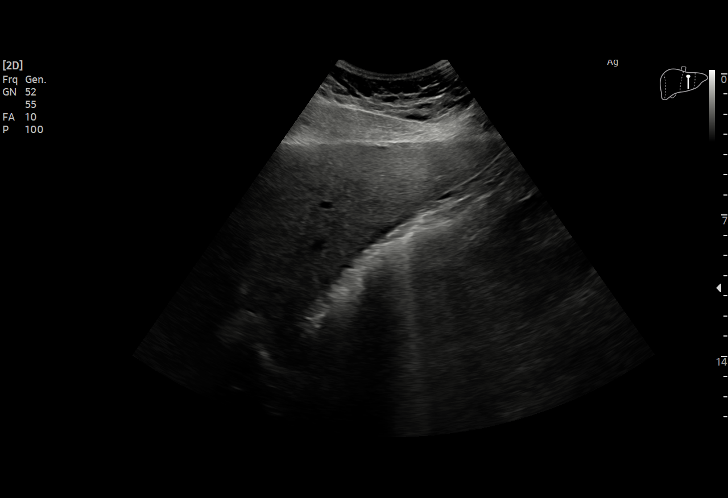
[im 17/33]
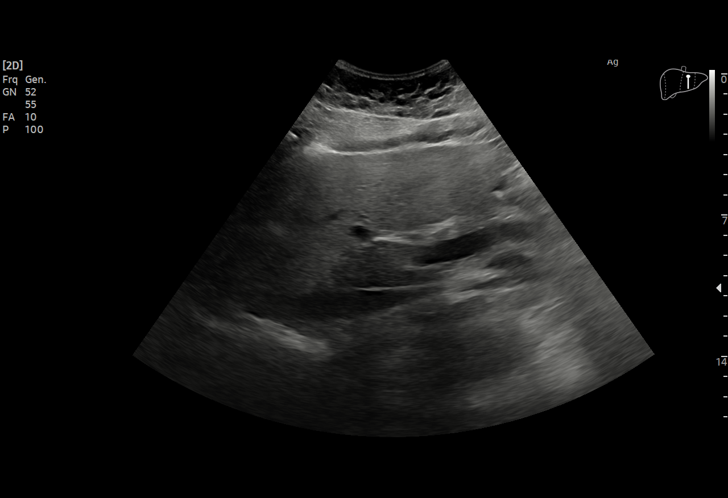
[im 19/33]
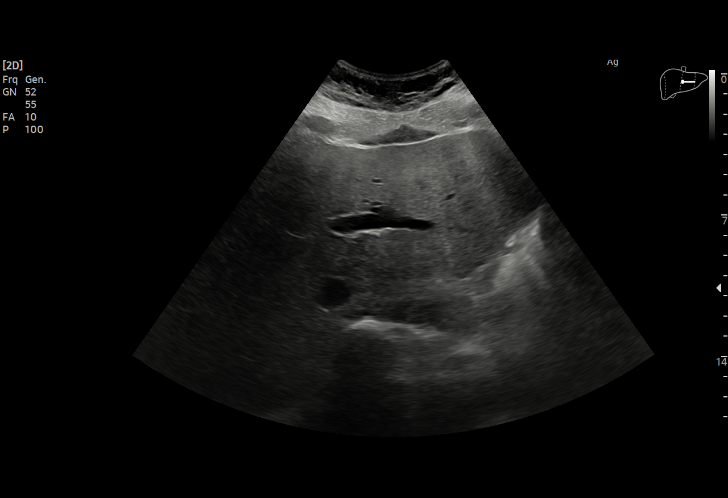
[im 21/33]
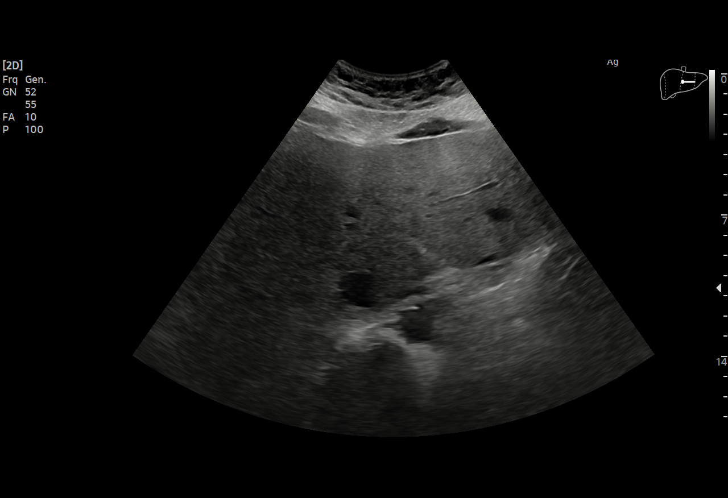
[im 23/33]
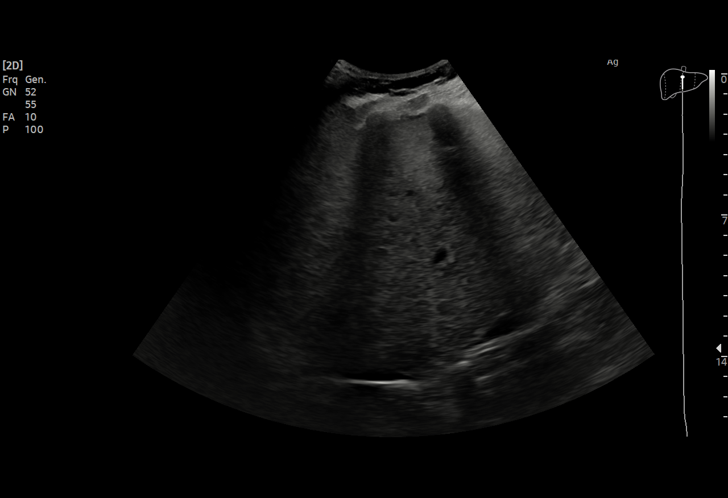
[im 26/33]
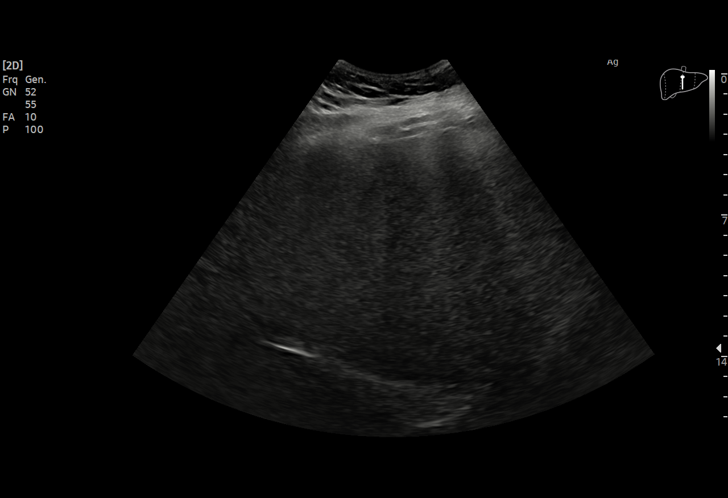
[im 27/33]
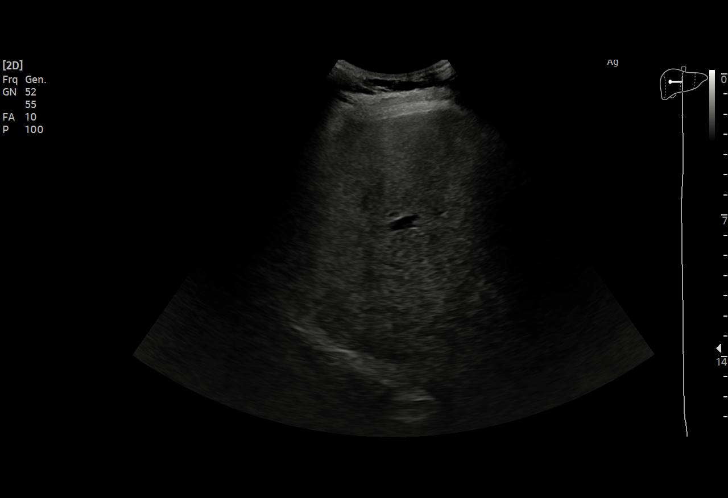
[im 30/33]
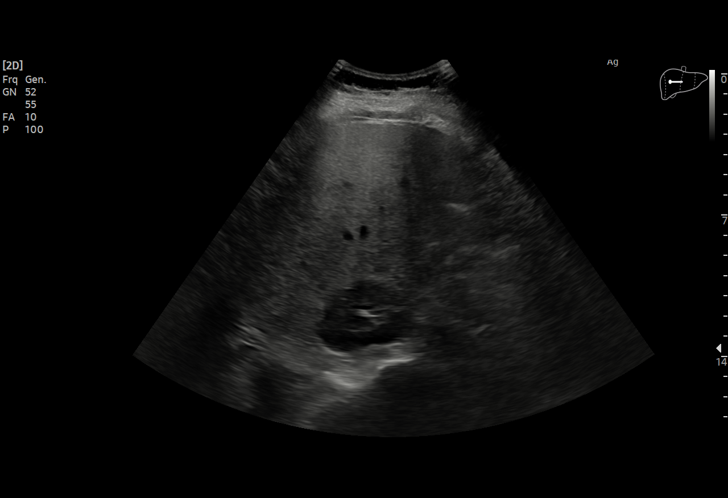
[im 33/33]
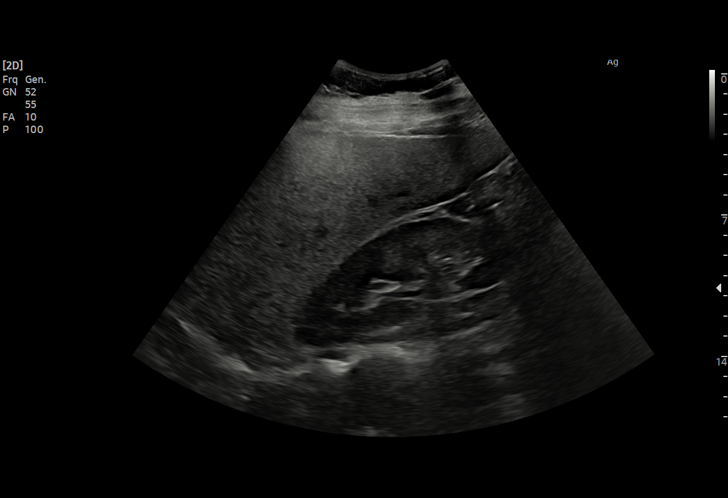

[15 of 25 positions shown; findings below may reference images not displayed]

FINDINGS: Gallbladder:

No gallstones or wall thickening visualized. No sonographic Murphy
sign noted by sonographer.

Common bile duct:

Diameter: 4 mm, normal.

Liver:

No focal lesion identified. Diffusely increased in parenchymal
echogenicity. Portal vein is patent on color Doppler imaging with
normal direction of blood flow towards the liver.

Other: None.
IMPRESSION: 1. Diffusely increased hepatic parenchymal echogenicity,
nonspecific, but suggestive of steatosis.

## 2022-04-30 ENCOUNTER — Other Ambulatory Visit: Payer: Self-pay | Admitting: Internal Medicine

## 2022-04-30 DIAGNOSIS — B009 Herpesviral infection, unspecified: Secondary | ICD-10-CM

## 2022-05-01 NOTE — Telephone Encounter (Signed)
Requested Prescriptions  Pending Prescriptions Disp Refills  . valACYclovir (VALTREX) 1000 MG tablet [Pharmacy Med Name: valACYclovir HCl Oral Tablet 1 GM] 90 tablet 0    Sig: TAKE ONE TABLET BY MOUTH ONCE DAILY     Antimicrobials:  Antiviral Agents - Anti-Herpetic Passed - 04/30/2022  1:02 PM      Passed - Valid encounter within last 12 months    Recent Outpatient Visits          3 months ago Elevated liver function tests   [facility] Reubin Milan, MD   11 months ago Essential hypertension   [facility] Reubin Milan, MD   1 year ago Annual physical exam   [facility] Reubin Milan, MD   2 years ago Annual physical exam   [facility] Reubin Milan, MD   3 years ago Annual physical exam   [facility] Reubin Milan, MD

## 2022-05-15 ENCOUNTER — Encounter: Payer: Self-pay | Admitting: Emergency Medicine

## 2022-05-15 ENCOUNTER — Ambulatory Visit
Admission: EM | Admit: 2022-05-15 | Discharge: 2022-05-15 | Disposition: A | Discharge status: Home / Self Care | Payer: 59 | Attending: Internal Medicine | Admitting: Internal Medicine

## 2022-05-15 DIAGNOSIS — K047 Periapical abscess without sinus: Secondary | ICD-10-CM | POA: Diagnosis not present

## 2022-05-15 DIAGNOSIS — L03211 Cellulitis of face: Secondary | ICD-10-CM | POA: Diagnosis not present

## 2022-05-15 MED ORDER — AMOXICILLIN-POT CLAVULANATE 875-125 MG PO TABS: 1.0000 | ORAL_TABLET | Freq: Two times a day (BID) | ORAL | 0 refills | Status: AC

## 2022-05-15 MED ORDER — HYDROCODONE-ACETAMINOPHEN 5-325 MG PO TABS: 1.0000 | ORAL_TABLET | Freq: Four times a day (QID) | ORAL | 0 refills | Status: DC | PRN

## 2022-05-15 MED ORDER — CEFTRIAXONE SODIUM 1 G IJ SOLR
2.0000 g | Freq: Once | INTRAMUSCULAR | Status: AC
  Administered 2022-05-15: 2 g via INTRAMUSCULAR

## 2022-05-15 NOTE — ED Provider Notes (Signed)
[facility]    CSN: [id number] Arrival date & time: 05/15/22  1300      History   Chief Complaint Chief Complaint  Patient presents with   Dental Problem   Dental Pain    HPI Lindsay Mack is a 41 y.o. female.  She presents today with 2-day history of tooth ache in the left lower jaw, with onset of marked swelling of the cheek and jaw last evening.  Today, pain keeps getting worse, and she has a headache also.  Has had a little bit of discomfort in that side of the mouth/with those teeth for the last few months.   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      Home Medications    Prior to Admission medications   Medication Sig Start Date End Date Taking? Authorizing Provider  amLODipine (NORVASC) 5 MG tablet Take 1 tablet (5 mg total) by mouth daily. 01/31/22   Reubin Milan, MD  hydrochlorothiazide (MICROZIDE) 12.5 MG capsule Take 1 capsule (12.5 mg total) by mouth daily. 01/31/22   Reubin Milan, MD  OMEPRAZOLE PO Take 5 mg by mouth daily.    [provider]  valACYclovir (VALTREX) 1000 MG tablet TAKE ONE TABLET BY MOUTH ONCE DAILY 05/01/22   Reubin Milan, MD    Family History Family History  Adopted: Yes  Family history unknown: Yes    Social History Social History   Tobacco Use   Smoking status: Every Day     Packs/day: 0.50    Years: 20.00    Total pack years: 10.00    Types: Cigarettes   Smokeless tobacco: Never  Vaping Use   Vaping Use: Never used  Substance Use Topics   Alcohol use: Yes    Alcohol/week: 14.0 standard drinks of alcohol    Types: 14 Standard drinks or equivalent per week    Comment: states drinks every night 1-2 glasses of wine or beer   Drug use: No     Allergies   Lactose and Lac bovis   Review of Systems Review of Systems   Physical Exam Triage Vital Signs ED Triage Vitals  Enc Vitals Group     BP 05/15/22 1314 (!) 150/100     Pulse Rate 05/15/22 1314 (!) 101     Resp 05/15/22 1314 19     Temp 05/15/22 1314 98.7 F (37.1 C)     Temp Source 05/15/22 1314 Oral     SpO2 05/15/22 1314 100 %     Weight --      Height --      Head Circumference --      Peak Flow --      Pain Score 05/15/22 1312 8     Pain Loc --      Pain Edu? --  Excl. in GC? --    No data found.  Updated Vital Signs BP (!) 150/100 (BP Location: Right Arm)   Pulse (!) 101   Temp 98.7 F (37.1 C) (Oral)   Resp 19   SpO2 100%   Visual Acuity Right Eye Distance:   Left Eye Distance:   Bilateral Distance:    Right Eye Near:   Left Eye Near:    Bilateral Near:     Physical Exam Constitutional:      General: She is not in acute distress.    Appearance: She is not ill-appearing.     Comments: Good hygiene  HENT:     Head:     Comments: Swelling and erythema of left jaw area as in photos Swelling is not brawny or tense Patient is not able to open her mouth very far due to pain and spasm    Mouth/Throat:     Mouth: Mucous membranes are moist.  Eyes:     Conjunctiva/sclera:     Right eye: Right conjunctiva is not injected. No exudate.    Left eye: Left conjunctiva is not injected. No exudate.    Comments: Conjugate gaze appreciated  Cardiovascular:     Rate and Rhythm: Regular rhythm. Tachycardia present.  Pulmonary:     Effort: Pulmonary effort is normal. No  respiratory distress.  Abdominal:     General: There is no distension.  Musculoskeletal:     Cervical back: Neck supple.     Comments: Walked into urgent care independently  Skin:    General: Skin is warm and dry.     Findings: No bruising.     Comments: No cyanosis  Neurological:     Mental Status: She is alert.     Comments: Speech is clear, coherent, logical            UC Treatments / Results  Labs (all labs ordered are listed, but only abnormal results are displayed) Labs Reviewed - No data to display  EKG   Radiology No results found.  Procedures Procedures (including critical care time)  Medications Ordered in UC Medications  cefTRIAXone (ROCEPHIN) injection 2 g (2 g Intramuscular Given 05/15/22 1338)    Initial Impression / Assessment and Plan / UC Course  I have reviewed the triage vital signs and the nursing notes.  Pertinent labs & imaging results that were available during my care of the patient were reviewed by me and considered in my medical decision making (see chart for details).     *** Final Clinical Impressions(s) / UC Diagnoses   Final diagnoses:  None   Discharge Instructions   None    ED Prescriptions   None    I have reviewed the PDMP during this encounter.

## 2022-05-15 NOTE — ED Triage Notes (Signed)
Pt presents with left lower dental pain x 2 days and facial swelling since last night. States symptoms began as a toothache and now has caused swelling. States have tried Advil, swishing with peroxide and salt water gargles.

## 2022-05-15 NOTE — Discharge Instructions (Addendum)
Symptoms and exam today are concerning for dental infection in the left lower jaw, involving surrounding mouth/cheek tissues.  Injection of ceftriaxone (antibiotic) was given at urgent care today.  Prescriptions for amoxicillin/clavulanate (antibiotic) and hydrocodone/acetaminophen (pain reliever, can take with advil but not with tylenol) were sent to the pharmacy. Take the amoxicillin/clavulanate with food and please start this medicine today.  Anticipate gradual improvement in swelling and pain in the next few days; please follow up with a dentist for definitive treatment in the next 7-10 days.

## 2022-07-13 ENCOUNTER — Other Ambulatory Visit: Payer: Self-pay | Admitting: Internal Medicine

## 2022-07-13 DIAGNOSIS — I10 Essential (primary) hypertension: Secondary | ICD-10-CM

## 2022-07-13 DIAGNOSIS — B009 Herpesviral infection, unspecified: Secondary | ICD-10-CM

## 2022-07-17 NOTE — Telephone Encounter (Signed)
Requested Prescriptions  Pending Prescriptions Disp Refills  . valACYclovir (VALTREX) 1000 MG tablet [Pharmacy Med Name: valACYclovir HCl Oral Tablet 1 GM] 90 tablet 0    Sig: TAKE ONE TABLET BY MOUTH ONCE DAILY     Antimicrobials:  Antiviral Agents - Anti-Herpetic Passed - 07/13/2022  1:19 PM      Passed - Valid encounter within last 12 months    Recent Outpatient Visits          5 months ago Elevated liver function tests   [place] [facility] at [facility], Nyoka Cowden, MD   1 year ago Essential hypertension   [place] [facility] at [facility], Nyoka Cowden, MD   1 year ago Annual physical exam   [place] [facility] at [facility], Nyoka Cowden, MD   2 years ago Annual physical exam   [place] [facility] at [facility], Nyoka Cowden, MD   3 years ago Annual physical exam   [facility] Medicine at [facility], Nyoka Cowden, MD             . amLODipine (NORVASC) 5 MG tablet [Pharmacy Med Name: amLODIPine Besylate Oral Tablet 5 MG] 90 tablet 0    Sig: TAKE ONE TABLET BY MOUTH ONCE DAILY     Cardiovascular: Calcium Channel Blockers 2 Failed - 07/13/2022  1:19 PM      Failed - Last BP in normal range    BP Readings from Last 1 Encounters:  05/15/22 (!) 150/100         Passed - Last Heart Rate in normal range    Pulse Readings from Last 1 Encounters:  05/15/22 (!) 101         Passed - Valid encounter within last 6 months    Recent Outpatient Visits          5 months ago Elevated liver function tests   [facility] at [facility], Nyoka Cowden, MD   1 year ago Essential hypertension   [place] [facility] at [facility], Nyoka Cowden, MD   1 year ago Annual physical exam   [place] [facility] Medicine at [facility], Nyoka Cowden, MD   2 years ago Annual physical exam   [place] [facility] at [facility], Nyoka Cowden, MD   3 years ago Annual physical exam   [facility] Care [facility] at [facility], Nyoka Cowden, MD             . hydrochlorothiazide (MICROZIDE) 12.5 MG capsule [Pharmacy Med Name: hydroCHLOROthiazide Oral Capsule 12.5 MG] 90 capsule 0    Sig: TAKE ONE CAPSULE BY MOUTH ONCE DAILY     Cardiovascular: Diuretics - Thiazide Failed - 07/13/2022  1:19 PM      Failed - Last BP in normal range    BP Readings from Last 1 Encounters:  05/15/22 (!) 150/100         Passed - Cr in normal range and within 180 days    Creatinine, Ser  Date Value Ref Range Status  01/31/2022 0.62 0.57 - 1.00 mg/dL Final         Passed - K in normal range and within 180 days    Potassium  Date Value  Ref Range Status  01/31/2022 4.4 3.5 - 5.2 mmol/L Final         Passed - Na in normal range and within 180 days    Sodium  Date Value Ref Range Status  01/31/2022 142 134 - 144 mmol/L Final         Passed - Valid encounter within last 6 months    Recent Outpatient Visits          5 months ago Elevated liver function tests   [place] [facility] at [facility], Nyoka Cowden, MD   1 year ago Essential hypertension   [place] [facility] at [facility], Nyoka Cowden, MD   1 year ago Annual physical exam   [facility] at [facility], Nyoka Cowden, MD   2 years ago Annual physical exam   [facility] and [facility] Medicine at [facility], Nyoka Cowden, MD   3 years ago Annual physical exam   [facility] Health Primary Care and Sports Medicine at [facility], Nyoka Cowden, MD

## 2022-11-19 ENCOUNTER — Other Ambulatory Visit: Payer: Self-pay | Admitting: Internal Medicine

## 2022-11-19 DIAGNOSIS — B009 Herpesviral infection, unspecified: Secondary | ICD-10-CM

## 2022-11-19 DIAGNOSIS — I10 Essential (primary) hypertension: Secondary | ICD-10-CM

## 2022-11-20 NOTE — Telephone Encounter (Signed)
Please schedule appt for pt.  KP

## 2023-02-14 ENCOUNTER — Other Ambulatory Visit: Payer: Self-pay | Admitting: Internal Medicine

## 2023-02-14 DIAGNOSIS — B009 Herpesviral infection, unspecified: Secondary | ICD-10-CM

## 2023-02-14 DIAGNOSIS — I10 Essential (primary) hypertension: Secondary | ICD-10-CM

## 2023-03-01 ENCOUNTER — Ambulatory Visit: Payer: 59 | Admitting: Internal Medicine

## 2023-03-01 ENCOUNTER — Encounter: Payer: Self-pay | Admitting: Internal Medicine

## 2023-03-01 VITALS — BP 124/86 | HR 98 | Ht 64.0 in | Wt 170.0 lb

## 2023-03-01 DIAGNOSIS — K219 Gastro-esophageal reflux disease without esophagitis: Secondary | ICD-10-CM | POA: Diagnosis not present

## 2023-03-01 DIAGNOSIS — I10 Essential (primary) hypertension: Secondary | ICD-10-CM

## 2023-03-01 DIAGNOSIS — R1013 Epigastric pain: Secondary | ICD-10-CM

## 2023-03-01 MED ORDER — PANTOPRAZOLE SODIUM 40 MG PO TBEC: 40.0000 mg | DELAYED_RELEASE_TABLET | Freq: Two times a day (BID) | ORAL | 1 refills | Status: DC

## 2023-03-01 NOTE — Progress Notes (Signed)
Date:  03/01/2023   Name:  Lindsay Mack   DOB:  1981/09/18   MRN:  [id number]   Chief Complaint: Hypertension  Hypertension This is a chronic problem. The problem is controlled. Pertinent negatives include no chest pain, headaches, palpitations or shortness of breath. Past treatments include calcium channel blockers and diuretics. The current treatment provides significant improvement.  Abdominal Pain This is a new problem. The onset quality is gradual. The problem occurs every several days. The problem has been unchanged. The pain is located in the generalized abdominal region. The pain is moderate. The quality of the pain is burning and a sensation of fullness. Associated symptoms include nausea, vomiting and weight loss. Pertinent negatives include no constipation, diarrhea or headaches. Nothing aggravates the pain. The pain is relieved by Nothing. She has tried proton pump inhibitors and antacids for the symptoms. The treatment provided no relief.    Lab Results  Component Value Date   NA 142 01/31/2022   K 4.4 01/31/2022   CO2 22 01/31/2022   GLUCOSE 97 01/31/2022   BUN 7 01/31/2022   CREATININE 0.62 01/31/2022   CALCIUM 9.3 01/31/2022   EGFR 115 01/31/2022   GFRNONAA 115 11/23/2020   Lab Results  Component Value Date   CHOL 267 (H) 11/23/2020   HDL 59 11/23/2020   LDLCALC 167 (H) 11/23/2020   TRIG 224 (H) 11/23/2020   CHOLHDL 4.5 (H) 11/23/2020   Lab Results  Component Value Date   TSH 1.920 11/23/2020   No results found for: "HGBA1C" Lab Results  Component Value Date   WBC 8.0 05/23/2021   HGB 15.1 05/23/2021   HCT 41.5 05/23/2021   MCV 103 (H) 05/23/2021   PLT 270 05/23/2021   Lab Results  Component Value Date   ALT 93 (H) 01/31/2022   AST 155 (H) 01/31/2022   ALKPHOS 124 (H) 01/31/2022   BILITOT 0.5 01/31/2022   No results found for: "25OHVITD2", "25OHVITD3", "VD25OH"   Review of Systems  Constitutional:  Positive for weight loss. Negative for  fatigue and unexpected weight change.  HENT:  Negative for nosebleeds.   Eyes:  Negative for visual disturbance.  Respiratory:  Negative for cough, chest tightness, shortness of breath and wheezing.   Cardiovascular:  Negative for chest pain, palpitations and leg swelling.  Gastrointestinal:  Positive for abdominal pain, nausea and vomiting. Negative for blood in stool, constipation and diarrhea.  Neurological:  Negative for dizziness, weakness, light-headedness and headaches.  Psychiatric/Behavioral:  Positive for sleep disturbance. Negative for dysphoric mood. The patient is not nervous/anxious.     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   Elevated TSH 12/14/2019   Elevated ferritin 12/11/2019   Macrocytosis 12/11/2019   Mild hyperlipidemia 11/11/2018   Tobacco use disorder 11/11/2018   Elevated liver function tests 01/23/2017   Scoliosis of lumbar spine 01/23/2017   Awareness of heartbeats 12/14/2016   Essential hypertension 12/14/2016   Herpes simplex type 2 infection 12/14/2016    Allergies  Allergen Reactions   Lactose Diarrhea   Milk (Cow) Other (See Comments)    Other Reaction: GI UPSET    Past Surgical History:  Procedure Laterality Date   NO PAST SURGERIES      Social History   Tobacco Use   Smoking status: Every Day    Packs/day: 0.50    Years: 22.00    Additional pack years: 0.00  Total pack years: 11.00    Types: Cigarettes   Smokeless tobacco: Never  Vaping Use   Vaping Use: Never used  Substance Use Topics   Alcohol use: Yes    Alcohol/week: 14.0 standard drinks of alcohol    Types: 14 Standard drinks or equivalent per week    Comment: states drinks every night 1-2 glasses of wine or beer   Drug use: No     Medication list has been reviewed and updated.  Current Meds  Medication Sig   amLODipine (NORVASC) 5 MG tablet TAKE  ONE TABLET BY MOUTH ONCE DAILY   hydrochlorothiazide (MICROZIDE) 12.5 MG capsule TAKE ONE CAPSULE BY MOUTH ONCE DAILY   pantoprazole (PROTONIX) 40 MG tablet Take 1 tablet (40 mg total) by mouth 2 (two) times daily.   [DISCONTINUED] OMEPRAZOLE PO Take 5 mg by mouth daily.       03/01/2023    3:32 PM 01/31/2022    9:48 AM 05/23/2021    8:27 AM 11/23/2020    8:52 AM  GAD 7 : Generalized Anxiety Score  Nervous, Anxious, on Edge 0 0 0 0  Control/stop worrying 0 0 0 0  Worry too much - different things 0 0 0 0  Trouble relaxing 0 0 0 0  Restless 0 0 0 0  Easily annoyed or irritable 0 0 0 0  Afraid - awful might happen 0 0 0 0  Total GAD 7 Score 0 0 0 0  Anxiety Difficulty Not difficult at all  Not difficult at all        03/01/2023    3:31 PM 01/31/2022    9:48 AM 05/23/2021    8:27 AM  Depression screen PHQ 2/9  Decreased Interest 0 0 0  Down, Depressed, Hopeless 0 0 0  PHQ - 2 Score 0 0 0  Altered sleeping 0 0 0  Tired, decreased energy 3 0 0  Change in appetite 2 0 0  Feeling bad or failure about yourself  2 0 0  Trouble concentrating 0 0 0  Moving slowly or fidgety/restless 0 0 0  Suicidal thoughts 0 0 0  PHQ-9 Score 7 0 0  Difficult doing work/chores Not difficult at all Not difficult at all Not difficult at all    BP Readings from Last 3 Encounters:  03/01/23 124/86  05/15/22 (!) 150/100  01/31/22 (!) 158/110    Physical Exam Vitals and nursing note reviewed.  Constitutional:      General: She is not in acute distress.    Appearance: Normal appearance. She is well-developed.  HENT:     Head: Normocephalic and atraumatic.  Cardiovascular:     Rate and Rhythm: Normal rate and regular rhythm.     Pulses: Normal pulses.     Heart sounds: No murmur heard. Pulmonary:     Effort: Pulmonary effort is normal. No respiratory distress.     Breath sounds: No wheezing or rhonchi.  Abdominal:     General: Abdomen is flat. There is no distension.     Palpations: Abdomen is  soft. There is no mass.     Tenderness: There is no abdominal tenderness.  Musculoskeletal:     Cervical back: Normal range of motion.     Right lower leg: No edema.     Left lower leg: No edema.  Lymphadenopathy:     Cervical: No cervical adenopathy.  Skin:    General: Skin is warm and dry.     Findings: No rash.  Neurological:     Mental Status: She is alert and oriented to person, place, and time.  Psychiatric:        Mood and Affect: Mood normal.        Behavior: Behavior normal.     Wt Readings from Last 3 Encounters:  03/01/23 170 lb (77.1 kg)  01/31/22 176 lb (79.8 kg)  07/20/21 183 lb (83 kg)    BP 124/86   Pulse 98   Ht  (1.626 m)   Wt 170 lb (77.1 kg)   SpO2 100%   BMI 29.18 kg/m   Assessment and Plan:  Problem List Items Addressed This Visit       Cardiovascular and Mediastinum   Essential hypertension - Primary    Clinically stable exam with well controlled BP on amlodipine and hctz. Tolerating medications without side effects. Pt to continue current regimen and low sodium diet.       Relevant Orders   Comprehensive metabolic panel     Digestive   Gastroesophageal reflux disease    GERD not controlled by omeprazole and TUMS Continue to elevate on wedge or pillows while sleeping Stop omeprazole and begin bid Pantoprazole Will get H Pylori and treat if positive Refer to GI if not improving      Relevant Medications   pantoprazole (PROTONIX) 40 MG tablet   Other Relevant Orders   CBC with Differential/Platelet   H. pylori breath test   Other Visit Diagnoses     Epigastric pain       Relevant Orders   US Abdomen Limited RUQ (LIVER/GB)       Return in about 6 months (around 08/31/2023) for CPX w/pap.   Partially dictated using Dragon software, any errors are not intentional.  Reubin Milan, MD [facility] Primary Care and Sports Medicine [place], [place]

## 2023-03-01 NOTE — Assessment & Plan Note (Signed)
GERD not controlled by omeprazole and TUMS Continue to elevate on wedge or pillows while sleeping Stop omeprazole and begin bid Pantoprazole Will get H Pylori and treat if positive Refer to GI if not improving

## 2023-03-01 NOTE — Assessment & Plan Note (Signed)
Clinically stable exam with well controlled BP on amlodipine and hctz. Tolerating medications without side effects. Pt to continue current regimen and low sodium diet.  

## 2023-03-03 LAB — COMPREHENSIVE METABOLIC PANEL
ALT: 84 IU/L — ABNORMAL HIGH (ref 0–32)
AST: 199 IU/L — ABNORMAL HIGH (ref 0–40)
Albumin/Globulin Ratio: 1.7 (ref 1.2–2.2)
Albumin: 4.7 g/dL (ref 3.9–4.9)
Alkaline Phosphatase: 132 IU/L — ABNORMAL HIGH (ref 44–121)
BUN/Creatinine Ratio: 9 (ref 9–23)
BUN: 6 mg/dL (ref 6–24)
Bilirubin Total: 0.8 mg/dL (ref 0.0–1.2)
CO2: 22 mmol/L (ref 20–29)
Calcium: 9.6 mg/dL (ref 8.7–10.2)
Chloride: 98 mmol/L (ref 96–106)
Creatinine, Ser: 0.66 mg/dL (ref 0.57–1.00)
Globulin, Total: 2.7 g/dL (ref 1.5–4.5)
Glucose: 97 mg/dL (ref 70–99)
Potassium: 3.2 mmol/L — ABNORMAL LOW (ref 3.5–5.2)
Sodium: 139 mmol/L (ref 134–144)
Total Protein: 7.4 g/dL (ref 6.0–8.5)
eGFR: 113 mL/min/{1.73_m2} (ref >59)

## 2023-03-03 LAB — CBC WITH DIFFERENTIAL/PLATELET
Basophils Absolute: 0.1 10*3/uL (ref 0.0–0.2)
Basos: 1 %
EOS (ABSOLUTE): 0.2 10*3/uL (ref 0.0–0.4)
Eos: 2 %
Hematocrit: 41.3 % (ref 34.0–46.6)
Hemoglobin: 15 g/dL (ref 11.1–15.9)
Immature Grans (Abs): 0 10*3/uL (ref 0.0–0.1)
Immature Granulocytes: 0 %
Lymphocytes Absolute: 2.3 10*3/uL (ref 0.7–3.1)
Lymphs: 24 %
MCH: 38.2 pg — ABNORMAL HIGH (ref 26.6–33.0)
MCHC: 36.3 g/dL — ABNORMAL HIGH (ref 31.5–35.7)
MCV: 105 fL — ABNORMAL HIGH (ref 79–97)
Monocytes Absolute: 0.4 10*3/uL (ref 0.1–0.9)
Monocytes: 4 %
Neutrophils Absolute: 6.6 10*3/uL (ref 1.4–7.0)
Neutrophils: 69 %
Platelets: 236 10*3/uL (ref 150–450)
RBC: 3.93 x10E6/uL (ref 3.77–5.28)
RDW: 11.4 % — ABNORMAL LOW (ref 11.7–15.4)
WBC: 9.6 10*3/uL (ref 3.4–10.8)

## 2023-03-03 LAB — H. PYLORI BREATH TEST: H pylori Breath Test: NEGATIVE

## 2023-03-13 ENCOUNTER — Ambulatory Visit: Payer: 59

## 2023-04-27 ENCOUNTER — Other Ambulatory Visit: Payer: Self-pay | Admitting: Internal Medicine

## 2023-04-27 DIAGNOSIS — K219 Gastro-esophageal reflux disease without esophagitis: Secondary | ICD-10-CM

## 2023-05-11 ENCOUNTER — Other Ambulatory Visit: Payer: Self-pay | Admitting: Internal Medicine

## 2023-05-11 DIAGNOSIS — I10 Essential (primary) hypertension: Secondary | ICD-10-CM

## 2023-05-31 ENCOUNTER — Other Ambulatory Visit: Payer: Self-pay | Admitting: Internal Medicine

## 2023-05-31 DIAGNOSIS — I10 Essential (primary) hypertension: Secondary | ICD-10-CM

## 2023-05-31 DIAGNOSIS — B009 Herpesviral infection, unspecified: Secondary | ICD-10-CM

## 2023-05-31 NOTE — Telephone Encounter (Signed)
Requested Prescriptions  Pending Prescriptions Disp Refills   hydrochlorothiazide (MICROZIDE) 12.5 MG capsule [Pharmacy Med Name: hydroCHLOROthiazide Oral Capsule 12.5 MG] 90 capsule 0    Sig: TAKE ONE CAPSULE BY MOUTH ONCE DAILY     Cardiovascular: Diuretics - Thiazide Failed - 05/31/2023 12:37 PM      Failed - K in normal range and within 180 days    Potassium  Date Value Ref Range Status  03/01/2023 3.2 (L) 3.5 - 5.2 mmol/L Final         Passed - Cr in normal range and within 180 days    Creatinine, Ser  Date Value Ref Range Status  03/01/2023 0.66 0.57 - 1.00 mg/dL Final         Passed - Na in normal range and within 180 days    Sodium  Date Value Ref Range Status  03/01/2023 139 134 - 144 mmol/L Final         Passed - Last BP in normal range    BP Readings from Last 1 Encounters:  03/01/23 124/86         Passed - Valid encounter within last 6 months    Recent Outpatient Visits           3 months ago Essential hypertension   [place] [facility] at [facility], Nyoka Cowden, MD   1 year ago Elevated liver function tests   [facility] Primary Care & Sports Medicine at [facility], Nyoka Cowden, MD   2 years ago Essential hypertension   [place] [facility] at [facility], Nyoka Cowden, MD   2 years ago Annual physical exam   [facility] Primary Care & Sports Medicine at [facility], Nyoka Cowden, MD   3 years ago Annual physical exam   [facility] Primary Care & Sports Medicine at [facility], Nyoka Cowden, MD       Future Appointments             In 3 months Judithann Graves Nyoka Cowden, MD [facility] Primary Care & Sports Medicine at [facility], [facility]             valACYclovir (VALTREX) 1000 MG tablet [Pharmacy Med Name: valACYclovir HCl Oral Tablet 1 GM] 90 tablet 0    Sig: TAKE ONE TABLET BY MOUTH ONCE DAILY     Antimicrobials:  Antiviral Agents - Anti-Herpetic Passed -  05/31/2023 12:37 PM      Passed - Valid encounter within last 12 months    Recent Outpatient Visits           3 months ago Essential hypertension   [place] [facility] Medicine at [facility], Nyoka Cowden, MD   1 year ago Elevated liver function tests   [facility] at [facility], Nyoka Cowden, MD   2 years ago Essential hypertension   [place] [facility] Medicine at [facility], Nyoka Cowden, MD   2 years ago Annual physical exam   [facility] Primary Care & Sports Medicine at [facility], Nyoka Cowden, MD   3 years ago Annual physical exam   [facility] Primary Care & Sports Medicine at [facility], Nyoka Cowden, MD       Future Appointments             In 3 months Judithann Graves Nyoka Cowden, MD [facility] Primary Care & Sports Medicine at [facility], [facility]

## 2023-08-29 ENCOUNTER — Other Ambulatory Visit: Payer: Self-pay | Admitting: Internal Medicine

## 2023-08-29 DIAGNOSIS — B009 Herpesviral infection, unspecified: Secondary | ICD-10-CM

## 2023-08-29 DIAGNOSIS — I10 Essential (primary) hypertension: Secondary | ICD-10-CM

## 2023-09-18 ENCOUNTER — Encounter: Payer: 59 | Admitting: Internal Medicine

## 2023-10-29 ENCOUNTER — Other Ambulatory Visit: Payer: Self-pay | Admitting: Internal Medicine

## 2023-10-29 DIAGNOSIS — K219 Gastro-esophageal reflux disease without esophagitis: Secondary | ICD-10-CM

## 2023-10-29 NOTE — Telephone Encounter (Signed)
Patient will need an office visit for further refills. Requested Prescriptions  Pending Prescriptions Disp Refills   pantoprazole (PROTONIX) 40 MG tablet [Pharmacy Med Name: Pantoprazole Sodium Oral Tablet Delayed Release 40 MG] 180 tablet 0    Sig: TAKE ONE TABLET BY MOUTH TWICE DAILY     Gastroenterology: Proton Pump Inhibitors Passed - 10/29/2023  4:23 PM      Passed - Valid encounter within last 12 months    Recent Outpatient Visits           8 months ago Essential hypertension   [place] [facility] Medicine at [facility], Nyoka Cowden, MD   1 year ago Elevated liver function tests   [facility] Sports Medicine at [facility], Nyoka Cowden, MD   2 years ago Essential hypertension   [place] [facility] Medicine at [facility], Nyoka Cowden, MD   2 years ago Annual physical exam   [facility] Primary Care & Sports Medicine at [facility], Nyoka Cowden, MD   3 years ago Annual physical exam   [facility] Primary Care & Sports Medicine at [facility], Nyoka Cowden, MD

## 2023-11-23 ENCOUNTER — Other Ambulatory Visit: Payer: Self-pay | Admitting: Internal Medicine

## 2023-11-23 DIAGNOSIS — B009 Herpesviral infection, unspecified: Secondary | ICD-10-CM

## 2023-11-23 DIAGNOSIS — I10 Essential (primary) hypertension: Secondary | ICD-10-CM

## 2023-11-26 ENCOUNTER — Encounter: Payer: Self-pay | Admitting: *Deleted

## 2023-11-26 NOTE — Telephone Encounter (Signed)
 Requested medication (s) are due for refill today:   Yes for all 3  Requested medication (s) are on the active medication list:   Yes for all 3  Future visit scheduled:   No.  Sent a MyChart message asking her to schedule her 6 month check up with Dr. Justus   Last ordered: Amlodipine  05/12/2023 #90, 1 refill;   Valtrex  08/29/2023 #90, 0 refills;   hydrochlorothiazide  08/29/2023 #90, 0 refills  Unable to refill because labs and a 6 month visit are due per protocol.      Requested Prescriptions  Pending Prescriptions Disp Refills   amLODipine  (NORVASC ) 5 MG tablet [Pharmacy Med Name: amLODIPine  Besylate Oral Tablet 5 MG] 90 tablet 0    Sig: TAKE ONE TABLET BY MOUTH ONCE DAILY     Cardiovascular: Calcium Channel Blockers 2 Failed - 11/26/2023  8:29 AM      Failed - Valid encounter within last 6 months    Recent Outpatient Visits           9 months ago Essential hypertension   [place] [facility] Care & Sports Medicine at [facility], Leita DEL, MD   1 year ago Elevated liver function tests   [facility] Primary Care & Sports Medicine at [facility], Leita DEL, MD   2 years ago Essential hypertension   [place] [facility] Care & Sports Medicine at [facility], Leita DEL, MD   3 years ago Annual physical exam   [facility] Health Primary Care & Sports Medicine at [facility], Leita DEL, MD   4 years ago Annual physical exam   [facility] Primary Care & Sports Medicine at [facility], Leita DEL, MD              Passed - Last BP in normal range    BP Readings from Last 1 Encounters:  03/01/23 124/86         Passed - Last Heart Rate in normal range    Pulse Readings from Last 1 Encounters:  03/01/23 98          valACYclovir  (VALTREX ) 1000 MG tablet [Pharmacy Med Name: valACYclovir  HCl Oral Tablet 1 GM] 90 tablet 0    Sig: TAKE ONE TABLET BY MOUTH ONCE DAILY     Antimicrobials:  Antiviral Agents - Anti-Herpetic Passed -  11/26/2023  8:29 AM      Passed - Valid encounter within last 12 months    Recent Outpatient Visits           9 months ago Essential hypertension   [place] Primary Care & Sports Medicine at [facility], Leita DEL, MD   1 year ago Elevated liver function tests   [facility] Primary Care & Sports Medicine at [facility], Leita DEL, MD   2 years ago Essential hypertension   [place] Primary Care & Sports Medicine at [facility], Leita DEL, MD   3 years ago Annual physical exam   [facility] Primary Care & Sports Medicine at [facility], Leita DEL, MD   4 years ago Annual physical exam   [facility] Primary Care & Sports Medicine at [facility], Leita DEL, MD               hydrochlorothiazide  (MICROZIDE ) 12.5 MG capsule [Pharmacy Med Name: hydroCHLOROthiazide  Oral Capsule 12.5 MG] 90 capsule 0    Sig: TAKE ONE CAPSULE BY MOUTH ONCE DAILY  Cardiovascular: Diuretics - Thiazide Failed - 11/26/2023  8:29 AM      Failed - Cr in normal range and within 180 days    Creatinine, Ser  Date Value Ref Range Status  03/01/2023 0.66 0.57 - 1.00 mg/dL Final         Failed - K in normal range and within 180 days    Potassium  Date Value Ref Range Status  03/01/2023 3.2 (L) 3.5 - 5.2 mmol/L Final         Failed - Na in normal range and within 180 days    Sodium  Date Value Ref Range Status  03/01/2023 139 134 - 144 mmol/L Final         Failed - Valid encounter within last 6 months    Recent Outpatient Visits           9 months ago Essential hypertension   [place] [facility] at [facility], Leita DEL, MD   1 year ago Elevated liver function tests   [facility] Primary Care & Sports Medicine at [facility], Leita DEL, MD   2 years ago Essential hypertension    Primary Care & Sports Medicine at [facility], Leita DEL, MD   3 years ago Annual  physical exam   [facility] Medicine at [facility], Leita DEL, MD   4 years ago Annual physical exam   [facility] Primary Care & Sports Medicine at [facility], Leita DEL, MD              Passed - Last BP in normal range    BP Readings from Last 1 Encounters:  03/01/23 124/86

## 2023-12-27 ENCOUNTER — Other Ambulatory Visit: Payer: Self-pay | Admitting: Internal Medicine

## 2023-12-27 DIAGNOSIS — B009 Herpesviral infection, unspecified: Secondary | ICD-10-CM

## 2023-12-27 DIAGNOSIS — I10 Essential (primary) hypertension: Secondary | ICD-10-CM

## 2023-12-30 NOTE — Telephone Encounter (Signed)
 Requested medication (s) are due for refill today: yes  Requested medication (s) are on the active medication list: yes  Last refill:  11/26/23  Future visit scheduled: no  Notes to clinic:  Unable to refill per protocol, courtesy refill already given, routing for provider approval.      Requested Prescriptions  Pending Prescriptions Disp Refills   valACYclovir (VALTREX) 1000 MG tablet [Pharmacy Med Name: valACYclovir HCl Oral Tablet 1 GM] 30 tablet 0    Sig: TAKE ONE TABLET BY MOUTH ONCE DAILY     Antimicrobials:  Antiviral Agents - Anti-Herpetic Passed - 12/30/2023 10:07 AM      Passed - Valid encounter within last 12 months    Recent Outpatient Visits           10 months ago Essential hypertension   [place] [facility] at [facility], Nyoka Cowden, MD   1 year ago Elevated liver function tests   [facility] at [facility], Nyoka Cowden, MD   2 years ago Essential hypertension   [place] [facility], Nyoka Cowden, MD   3 years ago Annual physical exam   [facility] at [facility], Nyoka Cowden, MD   4 years ago Annual physical exam   [facility] at [facility], Nyoka Cowden, MD               amLODipine (NORVASC) 5 MG tablet [Pharmacy Med Name: amLODIPine Besylate Oral Tablet 5 MG] 30 tablet 0    Sig: TAKE ONE TABLET BY MOUTH ONCE DAILY     Cardiovascular: Calcium Channel Blockers 2 Failed - 12/30/2023 10:07 AM      Failed - Valid encounter within last 6 months    Recent Outpatient Visits           10 months ago Essential hypertension   [place] [facility] at [facility], Nyoka Cowden, MD   1 year ago Elevated liver function tests   [facility] at [facility], Nyoka Cowden, MD   2 years ago Essential  hypertension   [place] [facility] at [facility], Nyoka Cowden, MD   3 years ago Annual physical exam   [facility] at [facility], Nyoka Cowden, MD   4 years ago Annual physical exam   [facility] at [facility], Nyoka Cowden, MD              Passed - Last BP in normal range    BP Readings from Last 1 Encounters:  03/01/23 124/86         Passed - Last Heart Rate in normal range    Pulse Readings from Last 1 Encounters:  03/01/23 98          hydrochlorothiazide (MICROZIDE) 12.5 MG capsule [Pharmacy Med Name: hydroCHLOROthiazide Oral Capsule 12.5 MG] 30 capsule 0    Sig: TAKE ONE CAPSULE BY MOUTH ONCE DAILY     Cardiovascular: Diuretics - Thiazide Failed - 12/30/2023 10:07 AM      Failed - Cr in normal range and within 180 days    Creatinine, Ser  Date Value Ref Range Status  03/01/2023 0.66 0.57 - 1.00 mg/dL Final  Failed - K in normal range and within 180 days    Potassium  Date Value Ref Range Status  03/01/2023 3.2 (L) 3.5 - 5.2 mmol/L Final         Failed - Na in normal range and within 180 days    Sodium  Date Value Ref Range Status  03/01/2023 139 134 - 144 mmol/L Final         Failed - Valid encounter within last 6 months    Recent Outpatient Visits           10 months ago Essential hypertension   [place] [facility] at [facility], Nyoka Cowden, MD   1 year ago Elevated liver function tests   [facility] at [facility], Nyoka Cowden, MD   2 years ago Essential hypertension   [place] [facility] at [facility], Nyoka Cowden, MD   3 years ago Annual physical exam   [facility] at [facility], Nyoka Cowden, MD   4 years ago Annual physical exam   [facility] at  [facility], Nyoka Cowden, MD              Passed - Last BP in normal range    BP Readings from Last 1 Encounters:  03/01/23 124/86

## 2024-01-29 ENCOUNTER — Other Ambulatory Visit: Payer: Self-pay | Admitting: Internal Medicine

## 2024-01-29 ENCOUNTER — Telehealth: Payer: Self-pay

## 2024-01-29 DIAGNOSIS — I10 Essential (primary) hypertension: Secondary | ICD-10-CM

## 2024-01-29 DIAGNOSIS — B009 Herpesviral infection, unspecified: Secondary | ICD-10-CM

## 2024-01-29 MED ORDER — AMLODIPINE BESYLATE 5 MG PO TABS: 5.0000 mg | ORAL_TABLET | Freq: Every day | ORAL | 0 refills | Status: DC

## 2024-01-29 MED ORDER — VALACYCLOVIR HCL 1 G PO TABS
1000.0000 mg | ORAL_TABLET | Freq: Every day | ORAL | 0 refills | Status: DC
Start: 2024-01-29 — End: 2024-02-10

## 2024-01-29 MED ORDER — HYDROCHLOROTHIAZIDE 12.5 MG PO CAPS: 12.5000 mg | ORAL_CAPSULE | Freq: Every day | ORAL | 0 refills | Status: DC

## 2024-01-29 NOTE — Telephone Encounter (Signed)
 Please review.  KP  Copied from CRM [id number]. Topic: Clinical - Prescription Issue >> Jan 29, 2024  1:43 PM Antwanette L wrote: Reason for CRM: The patient has an appt on 3/31 but will be out of medicine before her appointment. The patient is requesting a refill on amLODipine (NORVASC) 5 MG tablet, hydrochlorothiazide (MICROZIDE) 12.5 MG capsule, and valACYclovir (VALTREX) 1000 MG tablet. Patient can be contacted by phone at [phone]  Preferred Pharmacy [facility]  #[id number], [place] [address] Phone: [phone]  Fax: [phone]   ,

## 2024-01-29 NOTE — Telephone Encounter (Signed)
Sent pt message on Mychart.  KP

## 2024-02-07 ENCOUNTER — Ambulatory Visit: Admitting: Internal Medicine

## 2024-02-10 ENCOUNTER — Ambulatory Visit (INDEPENDENT_AMBULATORY_CARE_PROVIDER_SITE_OTHER): Payer: Self-pay | Admitting: Internal Medicine

## 2024-02-10 ENCOUNTER — Encounter: Payer: Self-pay | Admitting: Internal Medicine

## 2024-02-10 VITALS — BP 126/78 | HR 86 | Ht 64.0 in | Wt 193.0 lb

## 2024-02-10 DIAGNOSIS — R7989 Other specified abnormal findings of blood chemistry: Secondary | ICD-10-CM | POA: Diagnosis not present

## 2024-02-10 DIAGNOSIS — B009 Herpesviral infection, unspecified: Secondary | ICD-10-CM

## 2024-02-10 DIAGNOSIS — I10 Essential (primary) hypertension: Secondary | ICD-10-CM | POA: Diagnosis not present

## 2024-02-10 MED ORDER — HYDROCHLOROTHIAZIDE 12.5 MG PO CAPS: 12.5000 mg | ORAL_CAPSULE | Freq: Every day | ORAL | 1 refills | Status: DC

## 2024-02-10 MED ORDER — AMLODIPINE BESYLATE 5 MG PO TABS: 5.0000 mg | ORAL_TABLET | Freq: Every day | ORAL | 1 refills | Status: DC

## 2024-02-10 MED ORDER — VALACYCLOVIR HCL 1 G PO TABS: 1000.0000 mg | ORAL_TABLET | Freq: Every day | ORAL | 1 refills | Status: DC

## 2024-02-10 NOTE — Assessment & Plan Note (Addendum)
 Will repeat labs today and obtain FIB-4 score if she can afford the labs (insurance changed after she lost her job and had to go to [employer]) Will likely advise follow up with GI.

## 2024-02-10 NOTE — Progress Notes (Signed)
 Date:  02/10/2024   Name:  Lindsay Mack   DOB:  May 31, 1981   MRN:  [id number]   Chief Complaint: Hypertension  Hypertension This is a chronic problem. The problem is controlled. Pertinent negatives include no chest pain, headaches, palpitations or shortness of breath. Past treatments include calcium channel blockers and diuretics. The current treatment provides significant improvement.  Elevated LFTs - last labs for causes of elevated labs were done in 2022 by GI.  They recommended if still elevated to have a liver biopsy.  She never followed up when they were still high.  [place] suggested steatosis.    Review of Systems  Constitutional:  Negative for fatigue and unexpected weight change.  HENT:  Negative for trouble swallowing.   Eyes:  Negative for visual disturbance.  Respiratory:  Negative for cough, chest tightness, shortness of breath and wheezing.   Cardiovascular:  Negative for chest pain, palpitations and leg swelling.  Gastrointestinal:  Negative for abdominal pain, constipation and diarrhea.  Musculoskeletal:  Negative for arthralgias and myalgias.  Neurological:  Negative for dizziness, weakness, light-headedness and headaches.  Psychiatric/Behavioral:  Negative for dysphoric mood and sleep disturbance. The patient is not nervous/anxious.      Lab Results  Component Value Date   NA 139 03/01/2023   K 3.2 (L) 03/01/2023   CO2 22 03/01/2023   GLUCOSE 97 03/01/2023   BUN 6 03/01/2023   CREATININE 0.66 03/01/2023   CALCIUM 9.6 03/01/2023   EGFR 113 03/01/2023   GFRNONAA 115 11/23/2020   Lab Results  Component Value Date   CHOL 267 (H) 11/23/2020   HDL 59 11/23/2020   LDLCALC 167 (H) 11/23/2020   TRIG 224 (H) 11/23/2020   CHOLHDL 4.5 (H) 11/23/2020   Lab Results  Component Value Date   TSH 1.920 11/23/2020   No results found for: "HGBA1C" Lab Results  Component Value Date   WBC 9.6 03/01/2023   HGB 15.0 03/01/2023   HCT 41.3 03/01/2023   MCV 105 (H)  03/01/2023   PLT 236 03/01/2023   Lab Results  Component Value Date   ALT 84 (H) 03/01/2023   AST 199 (H) 03/01/2023   ALKPHOS 132 (H) 03/01/2023   BILITOT 0.8 03/01/2023   No results found for: "25OHVITD2", "25OHVITD3", "VD25OH"   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   Elevated TSH 12/14/2019   Elevated ferritin 12/11/2019   Macrocytosis 12/11/2019   Mild hyperlipidemia 11/11/2018   Tobacco use disorder 11/11/2018   Elevated liver function tests 01/23/2017   Scoliosis of lumbar spine 01/23/2017   Awareness of heartbeats 12/14/2016   Essential hypertension 12/14/2016   Herpes simplex type 2 infection 12/14/2016    Allergies  Allergen Reactions   Lactose Diarrhea   Milk (Cow) Other (See Comments)    Other Reaction: GI UPSET    Past Surgical History:  Procedure Laterality Date   NO PAST SURGERIES      Social History   Tobacco Use   Smoking status: Every Day    Current packs/day: 0.50    Average packs/day: 0.5 packs/day for 22.0 years (11.0 ttl pk-yrs)    Types: Cigarettes   Smokeless tobacco: Never  Vaping Use   Vaping status: Never Used  Substance Use Topics   Alcohol use: Yes    Alcohol/week: 14.0 standard drinks of alcohol    Types: 14 Standard drinks or equivalent per week  Comment: states drinks every night 1-2 glasses of wine or beer   Drug use: No     Medication list has been reviewed and updated.  Current Meds  Medication Sig   [DISCONTINUED] amLODipine (NORVASC) 5 MG tablet Take 1 tablet (5 mg total) by mouth daily.   [DISCONTINUED] hydrochlorothiazide (MICROZIDE) 12.5 MG capsule Take 1 capsule (12.5 mg total) by mouth daily.   [DISCONTINUED] valACYclovir (VALTREX) 1000 MG tablet Take 1 tablet (1,000 mg total) by mouth daily.       02/10/2024    3:14 PM 03/01/2023    3:32 PM 01/31/2022    9:48 AM 05/23/2021    8:27 AM  GAD  7 : Generalized Anxiety Score  Nervous, Anxious, on Edge 0 0 0 0  Control/stop worrying 0 0 0 0  Worry too much - different things 0 0 0 0  Trouble relaxing 0 0 0 0  Restless 0 0 0 0  Easily annoyed or irritable 0 0 0 0  Afraid - awful might happen 0 0 0 0  Total GAD 7 Score 0 0 0 0  Anxiety Difficulty Not difficult at all Not difficult at all  Not difficult at all       02/10/2024    3:13 PM 03/01/2023    3:31 PM 01/31/2022    9:48 AM  Depression screen PHQ 2/9  Decreased Interest 0 0 0  Down, Depressed, Hopeless 0 0 0  PHQ - 2 Score 0 0 0  Altered sleeping 0 0 0  Tired, decreased energy 0 3 0  Change in appetite 0 2 0  Feeling bad or failure about yourself  0 2 0  Trouble concentrating 0 0 0  Moving slowly or fidgety/restless 0 0 0  Suicidal thoughts 0 0 0  PHQ-9 Score 0 7 0  Difficult doing work/chores Not difficult at all Not difficult at all Not difficult at all    BP Readings from Last 3 Encounters:  02/10/24 126/78  03/01/23 124/86  05/15/22 (!) 150/100    Physical Exam Vitals and nursing note reviewed.  Constitutional:      General: She is not in acute distress.    Appearance: She is well-developed.  HENT:     Head: Normocephalic and atraumatic.  Neck:     Vascular: No carotid bruit.  Cardiovascular:     Rate and Rhythm: Normal rate and regular rhythm.  Pulmonary:     Effort: Pulmonary effort is normal. No respiratory distress.     Breath sounds: No wheezing or rhonchi.  Musculoskeletal:     Cervical back: Normal range of motion.     Right lower leg: No edema.     Left lower leg: No edema.  Lymphadenopathy:     Cervical: No cervical adenopathy.  Skin:    General: Skin is warm and dry.     Findings: No rash.  Neurological:     General: No focal deficit present.     Mental Status: She is alert and oriented to person, place, and time.  Psychiatric:        Mood and Affect: Mood normal.        Behavior: Behavior normal.     Wt Readings from Last 3  Encounters:  02/10/24 193 lb (87.5 kg)  03/01/23 170 lb (77.1 kg)  01/31/22 176 lb (79.8 kg)    BP 126/78   Pulse 86   Ht 5\' 4"  (1.626 m)   Wt 193 lb (87.5 kg)  LMP 02/05/2024 (Approximate)   SpO2 96%   BMI 33.13 kg/m   Assessment and Plan:  Problem List Items Addressed This Visit       Unprioritized   Essential hypertension - Primary (Chronic)   Blood pressure is well controlled.  Current medications amlodipine and hydrochlorothiazide. Will continue same regimen along with efforts to limit dietary sodium.       Relevant Medications   amLODipine (NORVASC) 5 MG tablet   hydrochlorothiazide (MICROZIDE) 12.5 MG capsule   Other Relevant Orders   CBC with Differential/Platelet   Lipid panel   TSH   Elevated liver function tests (Chronic)   Will repeat labs today and obtain FIB-4 score if she can afford the labs (insurance changed after she lost her job and had to go to [employer]) Will likely advise follow up with GI.      Relevant Orders   Comprehensive metabolic panel with GFR   Herpes simplex type 2 infection   Relevant Medications   valACYclovir (VALTREX) 1000 MG tablet   Elevated ferritin   Relevant Orders   Ferritin    Return in about 6 months (around 08/11/2024) for HTN.    Reubin Milan, MD [facility] [place]

## 2024-02-10 NOTE — Assessment & Plan Note (Signed)
 Blood pressure is well controlled.  Current medications amlodipine and hydrochlorothiazide. Will continue same regimen along with efforts to limit dietary sodium.

## 2024-02-11 LAB — COMPREHENSIVE METABOLIC PANEL WITH GFR
ALT: 45 IU/L — ABNORMAL HIGH (ref 0–32)
AST: 73 IU/L — ABNORMAL HIGH (ref 0–40)
Albumin: 4.4 g/dL (ref 3.9–4.9)
Alkaline Phosphatase: 109 IU/L (ref 44–121)
BUN/Creatinine Ratio: 10 (ref 9–23)
BUN: 7 mg/dL (ref 6–24)
Bilirubin Total: 0.4 mg/dL (ref 0.0–1.2)
CO2: 20 mmol/L (ref 20–29)
Calcium: 9.9 mg/dL (ref 8.7–10.2)
Chloride: 99 mmol/L (ref 96–106)
Creatinine, Ser: 0.69 mg/dL (ref 0.57–1.00)
Globulin, Total: 2.5 g/dL (ref 1.5–4.5)
Glucose: 125 mg/dL — ABNORMAL HIGH (ref 70–99)
Potassium: 3.4 mmol/L — ABNORMAL LOW (ref 3.5–5.2)
Sodium: 138 mmol/L (ref 134–144)
Total Protein: 6.9 g/dL (ref 6.0–8.5)
eGFR: 111 mL/min/{1.73_m2} (ref >59)

## 2024-02-11 LAB — CBC WITH DIFFERENTIAL/PLATELET
Basophils Absolute: 0.1 10*3/uL (ref 0.0–0.2)
Basos: 1 %
EOS (ABSOLUTE): 0.1 10*3/uL (ref 0.0–0.4)
Eos: 1 %
Hematocrit: 42.3 % (ref 34.0–46.6)
Hemoglobin: 15.5 g/dL (ref 11.1–15.9)
Immature Grans (Abs): 0.1 10*3/uL (ref 0.0–0.1)
Immature Granulocytes: 1 %
Lymphocytes Absolute: 2 10*3/uL (ref 0.7–3.1)
Lymphs: 18 %
MCH: 38 pg — ABNORMAL HIGH (ref 26.6–33.0)
MCHC: 36.6 g/dL — ABNORMAL HIGH (ref 31.5–35.7)
MCV: 104 fL — ABNORMAL HIGH (ref 79–97)
Monocytes Absolute: 0.6 10*3/uL (ref 0.1–0.9)
Monocytes: 6 %
Neutrophils Absolute: 7.9 10*3/uL — ABNORMAL HIGH (ref 1.4–7.0)
Neutrophils: 73 %
Platelets: 265 10*3/uL (ref 150–450)
RBC: 4.08 x10E6/uL (ref 3.77–5.28)
RDW: 11.8 % (ref 11.7–15.4)
WBC: 10.7 10*3/uL (ref 3.4–10.8)

## 2024-02-11 LAB — TSH: TSH: 1.18 u[IU]/mL (ref 0.450–4.500)

## 2024-02-11 LAB — LIPID PANEL
Chol/HDL Ratio: 6.2 ratio — ABNORMAL HIGH (ref 0.0–4.4)
Cholesterol, Total: 210 mg/dL — ABNORMAL HIGH (ref 100–199)
HDL: 34 mg/dL — ABNORMAL LOW (ref >39)
LDL Chol Calc (NIH): 132 mg/dL — ABNORMAL HIGH (ref 0–99)
Triglycerides: 248 mg/dL — ABNORMAL HIGH (ref 0–149)
VLDL Cholesterol Cal: 44 mg/dL — ABNORMAL HIGH (ref 5–40)

## 2024-02-11 LAB — FERRITIN: Ferritin: 480 ng/mL — ABNORMAL HIGH (ref 15–150)

## 2024-02-12 ENCOUNTER — Encounter: Payer: Self-pay | Admitting: Internal Medicine

## 2024-08-11 ENCOUNTER — Encounter: Payer: Self-pay | Admitting: Internal Medicine

## 2024-08-11 ENCOUNTER — Ambulatory Visit (INDEPENDENT_AMBULATORY_CARE_PROVIDER_SITE_OTHER): Admitting: Internal Medicine

## 2024-08-11 VITALS — BP 122/76 | HR 110 | Ht 64.0 in | Wt 193.0 lb

## 2024-08-11 DIAGNOSIS — B009 Herpesviral infection, unspecified: Secondary | ICD-10-CM

## 2024-08-11 DIAGNOSIS — R7989 Other specified abnormal findings of blood chemistry: Secondary | ICD-10-CM | POA: Diagnosis not present

## 2024-08-11 DIAGNOSIS — Z148 Genetic carrier of other disease: Secondary | ICD-10-CM

## 2024-08-11 DIAGNOSIS — I10 Essential (primary) hypertension: Secondary | ICD-10-CM | POA: Diagnosis not present

## 2024-08-11 MED ORDER — AMLODIPINE BESYLATE 5 MG PO TABS: 5.0000 mg | ORAL_TABLET | Freq: Every day | ORAL | 1 refills | Status: AC

## 2024-08-11 MED ORDER — VALACYCLOVIR HCL 1 G PO TABS
1000.0000 mg | ORAL_TABLET | Freq: Every day | ORAL | 1 refills | Status: AC
Start: 2024-08-11 — End: ?

## 2024-08-11 MED ORDER — HYDROCHLOROTHIAZIDE 12.5 MG PO CAPS: 12.5000 mg | ORAL_CAPSULE | Freq: Every day | ORAL | 1 refills | Status: AC

## 2024-08-11 NOTE — Assessment & Plan Note (Signed)
 Well controlled blood pressure today. Current regimen is amlodipine  and hydrochlorothiazide .. No medication side effects noted.

## 2024-08-11 NOTE — Assessment & Plan Note (Signed)
 Continue to monitor with little hesitancy to refer to GI for liver biopsy if increasing.

## 2024-08-11 NOTE — Assessment & Plan Note (Addendum)
 Ferritin had improved last visit so GI referral held. Will repeat today and advise.  Rationale for monitoring and possible biopsy recommendation discussed in detail. Lab Results  Component Value Date   FERRITIN 480 (H) 02/10/2024

## 2024-08-11 NOTE — Progress Notes (Signed)
 Date:  08/11/2024   Name:  Lindsay Mack   DOB:  [birth year]   MRN:  [id number]   Chief Complaint: Hypertension  Hypertension This is a chronic problem. The problem is controlled. Pertinent negatives include no chest pain, headaches, palpitations or shortness of breath. Past treatments include calcium channel blockers and diuretics. The current treatment provides significant improvement. There is no history of kidney disease, CAD/MI or CVA.  Elevated Ferritin - was improved last visit so GI referral paused.  Will repeat labs today.  She has cut her alcohol to about one drink every week or two.  She eats steak about twice a month, otherwise fish and chicken.   Review of Systems  Constitutional:  Negative for chills, fatigue and unexpected weight change.  HENT:  Negative for trouble swallowing.   Eyes:  Negative for visual disturbance.  Respiratory:  Negative for cough, chest tightness, shortness of breath and wheezing.   Cardiovascular:  Negative for chest pain, palpitations and leg swelling.  Gastrointestinal:  Negative for abdominal pain, constipation and diarrhea.  Genitourinary:  Negative for dysuria, menstrual problem and urgency.  Musculoskeletal:  Negative for arthralgias and myalgias.  Neurological:  Negative for dizziness, weakness, light-headedness and headaches.  Psychiatric/Behavioral:  Negative for dysphoric mood and sleep disturbance. The patient is not nervous/anxious.      Lab Results  Component Value Date   NA 138 02/10/2024   K 3.4 (L) 02/10/2024   CO2 20 02/10/2024   GLUCOSE 125 (H) 02/10/2024   BUN 7 02/10/2024   CREATININE 0.69 02/10/2024   CALCIUM 9.9 02/10/2024   EGFR 111 02/10/2024   GFRNONAA 115 11/23/2020   Lab Results  Component Value Date   CHOL 210 (H) 02/10/2024   HDL 34 (L) 02/10/2024   LDLCALC 132 (H) 02/10/2024   TRIG 248 (H) 02/10/2024   CHOLHDL 6.2 (H) 02/10/2024   Lab Results  Component Value Date   TSH 1.180 02/10/2024   No results  found for: HGBA1C Lab Results  Component Value Date   WBC 10.7 02/10/2024   HGB 15.5 02/10/2024   HCT 42.3 02/10/2024   MCV 104 (H) 02/10/2024   PLT 265 02/10/2024   Lab Results  Component Value Date   ALT 45 (H) 02/10/2024   AST 73 (H) 02/10/2024   ALKPHOS 109 02/10/2024   BILITOT 0.4 02/10/2024   Lab Results  Component Value Date   FERRITIN 480 (H) 02/10/2024    No results found for: MARIEN BOLLS, VD25OH   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   Elevated TSH 12/14/2019   Elevated ferritin 12/11/2019   Macrocytosis 12/11/2019   Mild hyperlipidemia 11/11/2018   Tobacco use disorder 11/11/2018   Elevated liver function tests 01/23/2017   Scoliosis of lumbar spine 01/23/2017   Awareness of heartbeats 12/14/2016   Essential hypertension 12/14/2016   Herpes simplex type 2 infection 12/14/2016    Allergies  Allergen Reactions   Lactose Diarrhea   Milk (Cow) Other (See Comments)    Other Reaction: GI UPSET    Past Surgical History:  Procedure Laterality Date   NO PAST SURGERIES      Social History   Tobacco Use   Smoking status: Every Day    Current packs/day: 0.50    Average packs/day: 0.5 packs/day for 22.0 years (11.0 ttl pk-yrs)    Types: Cigarettes   Smokeless tobacco: Never  Vaping  Use   Vaping status: Never Used  Substance Use Topics   Alcohol use: Yes    Alcohol/week: 14.0 standard drinks of alcohol    Types: 14 Standard drinks or equivalent per week    Comment: states drinks every night 1-2 glasses of wine or beer   Drug use: No     Medication list has been reviewed and updated.  Current Meds  Medication Sig   [DISCONTINUED] amLODipine  (NORVASC ) 5 MG tablet Take 1 tablet (5 mg total) by mouth daily.   [DISCONTINUED] hydrochlorothiazide  (MICROZIDE ) 12.5 MG capsule Take 1 capsule (12.5 mg total) by mouth daily.    [DISCONTINUED] valACYclovir  (VALTREX ) 1000 MG tablet Take 1 tablet (1,000 mg total) by mouth daily.       08/11/2024    2:36 PM 02/10/2024    3:14 PM 03/01/2023    3:32 PM 01/31/2022    9:48 AM  GAD 7 : Generalized Anxiety Score  Nervous, Anxious, on Edge 0 0 0 0  Control/stop worrying 0 0 0 0  Worry too much - different things 0 0 0 0  Trouble relaxing 0 0 0 0  Restless 0 0 0 0  Easily annoyed or irritable 0 0 0 0  Afraid - awful might happen 0 0 0 0  Total GAD 7 Score 0 0 0 0  Anxiety Difficulty Not difficult at all Not difficult at all Not difficult at all        08/11/2024    2:36 PM 02/10/2024    3:13 PM 03/01/2023    3:31 PM  Depression screen PHQ 2/9  Decreased Interest 0 0 0  Down, Depressed, Hopeless 0 0 0  PHQ - 2 Score 0 0 0  Altered sleeping 0 0 0  Tired, decreased energy 0 0 3  Change in appetite 0 0 2  Feeling bad or failure about yourself  0 0 2  Trouble concentrating 0 0 0  Moving slowly or fidgety/restless 0 0 0  Suicidal thoughts 0 0 0  PHQ-9 Score 0 0 7  Difficult doing work/chores Not difficult at all Not difficult at all Not difficult at all    BP Readings from Last 3 Encounters:  08/11/24 122/76  02/10/24 126/78  03/01/23 124/86    Physical Exam Vitals and nursing note reviewed.  Constitutional:      General: She is not in acute distress.    Appearance: Normal appearance. She is well-developed.  HENT:     Head: Normocephalic and atraumatic.  Cardiovascular:     Rate and Rhythm: Normal rate and regular rhythm.  Pulmonary:     Effort: Pulmonary effort is normal. No respiratory distress.     Breath sounds: No wheezing or rhonchi.  Musculoskeletal:     Cervical back: Normal range of motion.     Right lower leg: No edema.     Left lower leg: No edema.  Lymphadenopathy:     Cervical: No cervical adenopathy.  Skin:    General: Skin is warm and dry.     Findings: No rash.  Neurological:     Mental Status: She is alert and oriented to  person, place, and time.  Psychiatric:        Mood and Affect: Mood normal.        Behavior: Behavior normal.     Wt Readings from Last 3 Encounters:  08/11/24 193 lb (87.5 kg)  02/10/24 193 lb (87.5 kg)  03/01/23 170 lb (77.1 kg)    BP  122/76   Pulse (!) 110   Ht 5' 4 (1.626 m)   Wt 193 lb (87.5 kg)   SpO2 96%   BMI 33.13 kg/m   Assessment and Plan:  Problem List Items Addressed This Visit       Unprioritized   Carrier of hemochromatosis HFE gene mutation   Ferritin had improved last visit so GI referral held. Will repeat today and advise.  Rationale for monitoring and possible biopsy recommendation discussed in detail. Lab Results  Component Value Date   FERRITIN 480 (H) 02/10/2024         Relevant Orders   Ferritin   Elevated liver function tests (Chronic)   Continue to monitor with little hesitancy to refer to GI for liver biopsy if increasing.      Relevant Orders   Comprehensive metabolic panel with GFR   Essential hypertension - Primary (Chronic)   Well controlled blood pressure today. Current regimen is amlodipine  and hydrochlorothiazide . No medication side effects noted.        Relevant Medications   amLODipine  (NORVASC ) 5 MG tablet   hydrochlorothiazide  (MICROZIDE ) 12.5 MG capsule   Other Relevant Orders   Comprehensive metabolic panel with GFR   Herpes simplex type 2 infection   Relevant Medications   valACYclovir  (VALTREX ) 1000 MG tablet    Return for CPX w Pap.    Leita HILARIO Adie, MD [facility] [place]

## 2024-08-12 ENCOUNTER — Ambulatory Visit: Payer: Self-pay | Admitting: Internal Medicine

## 2024-08-12 LAB — COMPREHENSIVE METABOLIC PANEL WITH GFR
ALT: 26 IU/L (ref 0–32)
AST: 29 IU/L (ref 0–40)
Albumin: 4.8 g/dL (ref 3.9–4.9)
Alkaline Phosphatase: 100 IU/L (ref 41–116)
BUN/Creatinine Ratio: 14 (ref 9–23)
BUN: 10 mg/dL (ref 6–24)
Bilirubin Total: 0.4 mg/dL (ref 0.0–1.2)
CO2: 21 mmol/L (ref 20–29)
Calcium: 10.4 mg/dL — ABNORMAL HIGH (ref 8.7–10.2)
Chloride: 101 mmol/L (ref 96–106)
Creatinine, Ser: 0.69 mg/dL (ref 0.57–1.00)
Globulin, Total: 2.8 g/dL (ref 1.5–4.5)
Glucose: 102 mg/dL — ABNORMAL HIGH (ref 70–99)
Potassium: 3.6 mmol/L (ref 3.5–5.2)
Sodium: 140 mmol/L (ref 134–144)
Total Protein: 7.6 g/dL (ref 6.0–8.5)
eGFR: 110 mL/min/1.73 (ref >59)

## 2024-08-12 LAB — FERRITIN: Ferritin: 349 ng/mL — ABNORMAL HIGH (ref 15–150)

## 2024-10-02 ENCOUNTER — Encounter: Admitting: Internal Medicine
